# Patient Record
Sex: Male | Born: 1980 | Race: White | Hispanic: No | Marital: Married | Smoking: Never smoker
Health system: Southern US, Community
[De-identification: ages and names within clinical notes are randomized; demographics above are authoritative.]

## PROBLEM LIST (undated history)

## (undated) DIAGNOSIS — M519 Unspecified thoracic, thoracolumbar and lumbosacral intervertebral disc disorder: Secondary | ICD-10-CM

## (undated) HISTORY — DX: Unspecified thoracic, thoracolumbar and lumbosacral intervertebral disc disorder: M51.9

## (undated) HISTORY — PX: CHOLECYSTECTOMY: SHX55

---
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
## 2002-11-04 ENCOUNTER — Emergency Department (HOSPITAL_COMMUNITY): Admission: EM | Admit: 2002-11-04 | Discharge: 2002-11-04 | Payer: Self-pay | Admitting: Emergency Medicine

## 2002-11-15 ENCOUNTER — Encounter: Payer: Self-pay | Admitting: Emergency Medicine

## 2002-11-15 ENCOUNTER — Emergency Department (HOSPITAL_COMMUNITY): Admission: EM | Admit: 2002-11-15 | Discharge: 2002-11-15 | Payer: Self-pay | Admitting: Emergency Medicine

## 2002-11-30 ENCOUNTER — Ambulatory Visit (HOSPITAL_COMMUNITY): Admission: RE | Admit: 2002-11-30 | Discharge: 2002-11-30 | Payer: Self-pay | Admitting: Orthopedic Surgery

## 2002-11-30 ENCOUNTER — Encounter: Payer: Self-pay | Admitting: Orthopedic Surgery

## 2007-01-27 ENCOUNTER — Emergency Department (HOSPITAL_COMMUNITY): Admission: EM | Admit: 2007-01-27 | Discharge: 2007-01-27 | Payer: Self-pay | Admitting: Emergency Medicine

## 2007-02-21 ENCOUNTER — Ambulatory Visit: Payer: Self-pay | Admitting: Gastroenterology

## 2007-03-19 ENCOUNTER — Emergency Department (HOSPITAL_COMMUNITY): Admission: EM | Admit: 2007-03-19 | Discharge: 2007-03-19 | Payer: Self-pay | Admitting: Emergency Medicine

## 2007-05-31 DIAGNOSIS — R1013 Epigastric pain: Secondary | ICD-10-CM | POA: Insufficient documentation

## 2007-05-31 DIAGNOSIS — K8020 Calculus of gallbladder without cholecystitis without obstruction: Secondary | ICD-10-CM | POA: Insufficient documentation

## 2007-05-31 DIAGNOSIS — R112 Nausea with vomiting, unspecified: Secondary | ICD-10-CM

## 2010-08-24 NOTE — Assessment & Plan Note (Signed)
[facility]                         GASTROENTEROLOGY OFFICE NOTE   NAME:DAVISPerl, Kerney                    MRN:          [id number]  DATE:02/21/2007                            DOB:          October 07, 1980    REFERRING PHYSICIAN:  Dr. Vernie Murders [facility]   REASON FOR REFERRAL:  Dr. Patrica Duel asked me to evaluate Benjamin Jacobs in  consultation regarding biliary colic.   HISTORY OF PRESENT ILLNESS:  Benjamin Jacobs is a very pleasant 29 year old  man who presented to the emergency room 3-4 weeks ago with right upper  quadrant epigastric pain. The pain was very severe. It lasted 3-4 hours.  He did have associated nausea and vomiting. Lab tests and imaging  studies were performed. Labs showed normal CBC, normal complete  metabolic profile, normal liver tests. He had an ultrasound performed  and this showed a 6-mm gallstone in the gallbladder neck without  pericholecystic fluid or gallbladder wall thickening. Common bile duct  was normal. There is no duct dilatations. He was recommended to stay  away from fatty foods and was told to have a gastroenterologist  consultation. He has avoided fatty foods for the most part, although he  went to [employer] a couple of times and did again feel some right  upper quadrant discomfort that was pretty severe, but lasted only for 30-  40 minutes. There was no associated nausea or vomiting then. He does not  take any NSAIDs. He has no GERD.   REVIEW OF SYSTEMS:  Notable for stable weight, otherwise essentially  normal and is available on his nursing intake sheet.   PAST MEDICAL HISTORY:  None.   CURRENT MEDICATIONS:  None.   ALLERGIES:  No known drug allergies.   SOCIAL HISTORY:  Married with one 53-year-old daughter. Works for [employer]	[place]. Nonsmoker and nondrinker.   FAMILY HISTORY:  No colon cancer, colon polyps or gallbladder disease in  family.   PHYSICAL EXAMINATION:  Height 5 feet, 10 inches. Weight is 208 pounds,  blood pressure 120/72, pulse 86.  CONSTITUTIONAL: Generally well-appearing.  NEUROLOGIC: Alert and oriented x3.  EYES: Extraocular movements intact.  MOUTH: Oropharynx moist. No lesions.  NECK: Supple. No lymphadenopathy.  CARDIOVASCULAR: HEART: Regular rate and rhythm.  LUNGS:  Clear to auscultation bilaterally.  ABDOMEN: Soft and nontender, nondistended. Normal bowel sounds.  EXTREMITIES: No lower extremity edema.  SKIN: No rashes or lesions on visible extremities.   ASSESSMENT/PLAN:  A 30 year old man with likely biliary colic.   Clinically, it is consistent with biliary colic and he has a 6-mm stone  in the region of the neck of his gallbladder. Fatty foods are tending to  bring on right upper quadrant pains. He does not take NSAIDs and has no  GERD symptoms to warrant other GI evaluation and so I will arrange for  him to meet a general surgeon to consider laparoscopic cholecystectomy.  I see no reason for any further blood tests or imaging studies prior to  then.     Rachael Fee, MD  Electronically Signed    DPJ/MedQ  DD: 02/21/2007  DT: 02/21/2007  Job #: [id number]

## 2011-01-17 LAB — CBC
HCT: 43.8
Hemoglobin: 15.7
MCHC: 35.9
MCV: 94.2
Platelets: 226
RBC: 4.65
RDW: 12
WBC: 8.7

## 2011-01-17 LAB — DIFFERENTIAL
Basophils Absolute: 0
Basophils Relative: 0
Eosinophils Relative: 2
Lymphocytes Relative: 29
Monocytes Absolute: 0.5
Neutro Abs: 5.5

## 2011-01-17 LAB — LIPASE, BLOOD: Lipase: 26

## 2011-01-17 LAB — URINALYSIS, ROUTINE W REFLEX MICROSCOPIC
Bilirubin Urine: NEGATIVE
Glucose, UA: NEGATIVE
Hgb urine dipstick: NEGATIVE
Ketones, ur: NEGATIVE
Nitrite: NEGATIVE
Protein, ur: NEGATIVE
Specific Gravity, Urine: 1.016
Urobilinogen, UA: 0.2
pH: 7.5

## 2011-01-17 LAB — COMPREHENSIVE METABOLIC PANEL
AST: 24
Alkaline Phosphatase: 74
BUN: 11
CO2: 28
Chloride: 104
Creatinine, Ser: 1.22
GFR calc non Af Amer: >60
Potassium: 3.5
Total Bilirubin: 1.2

## 2011-01-19 LAB — CBC
MCHC: 36.1 — ABNORMAL HIGH
MCV: 94.3
Platelets: 215
RBC: 4.47
RDW: 12

## 2011-01-19 LAB — COMPREHENSIVE METABOLIC PANEL
ALT: 23
AST: 25
Albumin: 4.1
Calcium: 9.5
Creatinine, Ser: 1.11
GFR calc Af Amer: >60
GFR calc non Af Amer: >60
Sodium: 141
Total Protein: 6.9

## 2011-01-19 LAB — DIFFERENTIAL
Eosinophils Absolute: 0.1
Eosinophils Relative: 1
Lymphocytes Relative: 21
Lymphs Abs: 2
Monocytes Relative: 3

## 2011-01-19 LAB — URINALYSIS, ROUTINE W REFLEX MICROSCOPIC
Bilirubin Urine: NEGATIVE
Hgb urine dipstick: NEGATIVE
Nitrite: NEGATIVE
Protein, ur: NEGATIVE
Specific Gravity, Urine: 1.029
Urobilinogen, UA: 0.2

## 2011-01-19 LAB — LIPASE, BLOOD: Lipase: 28

## 2012-07-02 ENCOUNTER — Ambulatory Visit: Payer: BC Managed Care – PPO

## 2015-07-31 ENCOUNTER — Emergency Department (HOSPITAL_COMMUNITY): Payer: 59

## 2015-07-31 ENCOUNTER — Encounter (HOSPITAL_COMMUNITY): Payer: Self-pay | Admitting: Emergency Medicine

## 2015-07-31 ENCOUNTER — Emergency Department (HOSPITAL_COMMUNITY)
Admission: EM | Admit: 2015-07-31 | Discharge: 2015-07-31 | Disposition: A | Discharge status: Home / Self Care | Payer: 59 | Attending: Emergency Medicine | Admitting: Emergency Medicine

## 2015-07-31 DIAGNOSIS — N200 Calculus of kidney: Secondary | ICD-10-CM | POA: Diagnosis not present

## 2015-07-31 DIAGNOSIS — M545 Low back pain, unspecified: Secondary | ICD-10-CM

## 2015-07-31 LAB — CBC WITH DIFFERENTIAL/PLATELET
BASOS ABS: 0 10*3/uL (ref 0.0–0.1)
Basophils Relative: 0 %
EOS ABS: 0 10*3/uL (ref 0.0–0.7)
EOS PCT: 0 %
HCT: 40.1 % (ref 39.0–52.0)
HEMOGLOBIN: 14.1 g/dL (ref 13.0–17.0)
Lymphocytes Relative: 6 %
Lymphs Abs: 0.8 10*3/uL (ref 0.7–4.0)
MCH: 33.2 pg (ref 26.0–34.0)
MCHC: 35.2 g/dL (ref 30.0–36.0)
MCV: 94.4 fL (ref 78.0–100.0)
Monocytes Absolute: 0.4 10*3/uL (ref 0.1–1.0)
Monocytes Relative: 3 %
NEUTROS PCT: 91 %
Neutro Abs: 10.6 10*3/uL — ABNORMAL HIGH (ref 1.7–7.7)
PLATELETS: 206 10*3/uL (ref 150–400)
RBC: 4.25 MIL/uL (ref 4.22–5.81)
RDW: 11.5 % (ref 11.5–15.5)
WBC: 11.8 10*3/uL — AB (ref 4.0–10.5)

## 2015-07-31 LAB — BASIC METABOLIC PANEL
Anion gap: 12 (ref 5–15)
BUN: 19 mg/dL (ref 6–20)
CO2: 24 mmol/L (ref 22–32)
CREATININE: 1.23 mg/dL (ref 0.61–1.24)
Calcium: 9.4 mg/dL (ref 8.9–10.3)
Chloride: 103 mmol/L (ref 101–111)
Glucose, Bld: 131 mg/dL — ABNORMAL HIGH (ref 65–99)
POTASSIUM: 3.8 mmol/L (ref 3.5–5.1)
SODIUM: 139 mmol/L (ref 135–145)

## 2015-07-31 LAB — URINALYSIS, ROUTINE W REFLEX MICROSCOPIC
Bilirubin Urine: NEGATIVE
Glucose, UA: NEGATIVE mg/dL
Ketones, ur: 15 mg/dL — AB
Leukocytes, UA: NEGATIVE
NITRITE: NEGATIVE
PROTEIN: NEGATIVE mg/dL
SPECIFIC GRAVITY, URINE: 1.024 (ref 1.005–1.030)
pH: 7.5 (ref 5.0–8.0)

## 2015-07-31 LAB — URINE MICROSCOPIC-ADD ON: SQUAMOUS EPITHELIAL / LPF: NONE SEEN

## 2015-07-31 MED ORDER — HYDROCODONE-ACETAMINOPHEN 5-325 MG PO TABS: 1.0000 | ORAL_TABLET | Freq: Four times a day (QID) | ORAL | Status: DC | PRN

## 2015-07-31 MED ORDER — SODIUM CHLORIDE 0.9 % IV BOLUS (SEPSIS)
1000.0000 mL | Freq: Once | INTRAVENOUS | Status: AC
  Administered 2015-07-31: 1000 mL via INTRAVENOUS

## 2015-07-31 MED ORDER — MORPHINE SULFATE (PF) 4 MG/ML IV SOLN
4.0000 mg | Freq: Once | INTRAVENOUS | Status: AC
  Administered 2015-07-31: 4 mg via INTRAVENOUS
  Filled 2015-07-31: qty 1

## 2015-07-31 MED ORDER — ONDANSETRON HCL 4 MG/2ML IJ SOLN: 4.0000 mg | Freq: Once | INTRAMUSCULAR | Status: DC | PRN

## 2015-07-31 MED ORDER — NAPROXEN 500 MG PO TABS: 500.0000 mg | ORAL_TABLET | Freq: Two times a day (BID) | ORAL | Status: DC

## 2015-07-31 NOTE — ED Notes (Signed)
Patient transported to CT 

## 2015-07-31 NOTE — ED Notes (Signed)
Patient with back pain that started suddenly today around 0130.  Patient denies any injury to the area.  Denies any heavy lifting.

## 2015-07-31 NOTE — ED Provider Notes (Signed)
CSN: [id number]     Arrival date & time 07/31/15  4098 History   First MD Initiated Contact with Patient 07/31/15 0600     Chief Complaint  Patient presents with  . Back Pain     (Consider location/radiation/quality/duration/timing/severity/associated sxs/prior Treatment) Patient is a 35 y.o. male presenting with back pain. The history is provided by the patient and medical records. No language interpreter was used.  Back Pain Associated symptoms: no abdominal pain, no dysuria, no fever and no headaches      KINNETH FUJIWARA is a 35 y.o. male  with no pertinent PMH who presents to the Emergency Department complaining of acute onset of waxing-waning left lower back pain which woke him from his sleep at around 1:30 this morning (4 hours PTA). No medications taken prior to arrival. Patient admits to one episode of emesis and nausea which has now resolved. Patient denies abdominal pain, b/b incontinence, dysuria, hematuria, saddle anesthesia, chest pain, fever. No aggravating or alleviating factors noted. Denies injury or prior back injury.   History reviewed. No pertinent past medical history. History reviewed. No pertinent past surgical history. No family history on file. Social History  Substance Use Topics  . Smoking status: Never Smoker   . Smokeless tobacco: None  . Alcohol Use: Yes    Review of Systems  Constitutional: Negative for fever and chills.  HENT: Negative for congestion.   Eyes: Negative for visual disturbance.  Respiratory: Negative for cough and shortness of breath.   Cardiovascular: Negative.   Gastrointestinal: Positive for nausea and vomiting. Negative for abdominal pain, diarrhea and constipation.  Genitourinary: Negative for dysuria and hematuria.  Musculoskeletal: Positive for back pain. Negative for neck pain.  Skin: Negative for rash.  Neurological: Negative for dizziness and headaches.      Allergies  Review of patient's allergies indicates no known  allergies.  Home Medications   Prior to Admission medications   Medication Sig Start Date End Date Taking? Authorizing Provider  HYDROcodone-acetaminophen (NORCO/VICODIN) 5-325 MG tablet Take 1 tablet by mouth every 6 (six) hours as needed for severe pain. 07/31/15   Chase Picket Karan Inclan, PA-C  naproxen (NAPROSYN) 500 MG tablet Take 1 tablet (500 mg total) by mouth 2 (two) times daily. 07/31/15   Osby Sweetin Pilcher Emon Miggins, PA-C   BP 96/61 mmHg  Pulse 72  Temp(Src) 98.2 F (36.8 C) (Oral)  Resp 16  Ht  (1.727 m)  Wt 94.15 kg  BMI 31.57 kg/m2  SpO2 99% Physical Exam  Constitutional: He is oriented to person, place, and time. He appears well-developed and well-nourished.  Alert and in no acute distress  HENT:  Head: Normocephalic and atraumatic.  Neck:  Full range of motion without pain. No midline or paraspinal tenderness.  Cardiovascular: Normal rate, regular rhythm, normal heart sounds and intact distal pulses.  Exam reveals no gallop and no friction rub.   No murmur heard. Pulmonary/Chest: Effort normal and breath sounds normal. No respiratory distress. He has no wheezes. He has no rales. He exhibits no tenderness.  Abdominal: Soft. Bowel sounds are normal. He exhibits no distension and no mass. There is no tenderness. There is no rebound and no guarding.  No CVA tenderness.  Musculoskeletal:  Full range of motion. No midline or paraspinal tenderness. Pain is not reproducible with palpation. No overlying skin changes. 5/5 muscle strength 4.  Neurological: He is alert and oriented to person, place, and time.  Bilateral lower extremities neurovascularly intact.  Skin: Skin is warm and  dry.  Nursing note and vitals reviewed.   ED Course  Procedures (including critical care time) Labs Review Labs Reviewed  URINALYSIS, ROUTINE W REFLEX MICROSCOPIC (NOT AT [facility]) - Abnormal; Notable for the following:    APPearance CLOUDY (*)    Hgb urine dipstick LARGE (*)    Ketones, ur 15 (*)     All other components within normal limits  BASIC METABOLIC PANEL - Abnormal; Notable for the following:    Glucose, Bld 131 (*)    All other components within normal limits  CBC WITH DIFFERENTIAL/PLATELET - Abnormal; Notable for the following:    WBC 11.8 (*)    Neutro Abs 10.6 (*)    All other components within normal limits  URINE MICROSCOPIC-ADD ON - Abnormal; Notable for the following:    Bacteria, UA RARE (*)    All other components within normal limits    Imaging Review Ct Renal Stone Study  07/31/2015  CLINICAL DATA:  Left-sided flank pain for few hours EXAM: CT ABDOMEN AND PELVIS WITHOUT CONTRAST TECHNIQUE: Multidetector CT imaging of the abdomen and pelvis was performed following the standard protocol without IV contrast. COMPARISON:  None. FINDINGS: Lung bases are free of acute infiltrate or sizable effusion. The liver, spleen, adrenal glands and pancreas are within normal limits. The gallbladder has been surgically removed. Kidneys demonstrate no renal calculi. The right ureter and collecting system are within normal limits. Minimal fullness of the left ureter is seen although no ureteral calculus is noted. The bladder is well distended and there is a dependent 3 mm stone identified within the bladder best seen on image number 76 of series 201. These changes are consistent with a recently passed stone and a edema in the collecting system on the left. The appendix is within normal limits. Scattered diverticular change is noted without diverticulitis. No pelvic mass lesion is seen. The osseous structures are within normal limits. IMPRESSION: Changes consistent with a 3 mm recently passed stone within the urinary bladder. Mild dilatation is noted on the left consistent with edema from recently passed stone. Electronically Signed   By: Alcide CleverMark  Lukens M.D.   On: 07/31/2015 08:13   I have personally reviewed and evaluated these images and lab results as part of my medical decision-making.   EKG  Interpretation None      MDM   Final diagnoses:  Left low back pain  Kidney stone   Kingsley SpittleAlexander M Pha presents to ED for acute onset of left lower back pain that began about 4 hours prior to arrival. No history of back pain or recent injury. Otherwise healthy. Concern for kidney stone. IV fluids started.  7:44 AM - Patient re-evaluated. Pain controlled. Denies nausea. BMP reassuring. CBC reviewed - white count of 11.8. UA without signs of infection. Awaiting CT.   CT shows changes consistent with a recently passed 3 mm stone which is now in the bladder. No other acute findings are seen. Repeat abdominal exam with no abdominal tenderness, no surgical abdomen.  Will discharge to home with naproxen, short course of pain medication. Return precautions were given and all questions were answered.    [facility] HospitalJaime Pilcher Massiel Stipp, PA-C 07/31/15 [id number]  Shon Batonourtney F Horton, MD 07/31/15 2306

## 2015-07-31 NOTE — Discharge Instructions (Signed)
You have been diagnosed with kidney stones.  Use your pain medication only as needed for severe pain and do not operate heavy machinery while on this medication. Note that your pain medication contains Acetaminophen (Tylenol), therefore it is not recommended to take additional Tylenol while on your pain medication. Continue to drink fluids to help you pass the stones. Follow up with your primary care doctor in regards to your hospital visit.  Return to the ED immediately if you develop fever that persists > 101, uncontrolled pain or vomiting, or other concerns.  Read the instructions below to learn more about kidney stones.    Kidney Stones Kidney stones (ureteral lithiasis) are solid masses that form inside your kidneys. The intense pain is caused by the stone moving through the kidney, ureter, bladder, and urethra (urinary tract). When the stone moves, the ureter starts to spasm around the stone. The stone is usually passed in the urine.   HOME CARE  Drink enough fluids to keep your urine clear or pale yellow. This helps to get the stone out.   Only take medicine as told by your doctor.   Follow up with your doctor as told.   GET HELP RIGHT AWAY IF:   Your pain does not get better with medicine.   You have a fever.   Your pain increases and gets worse over 18 hours.   You have new belly (abdominal) pain.   You feel faint or pass out.   MAKE SURE YOU:   Understand these instructions.   Will watch your condition.   Will get help right away if you are not doing well or get worse.

## 2015-07-31 NOTE — ED Notes (Signed)
Pt given urine strainer

## 2016-09-09 ENCOUNTER — Ambulatory Visit (INDEPENDENT_AMBULATORY_CARE_PROVIDER_SITE_OTHER): Payer: Managed Care, Other (non HMO) | Admitting: Family

## 2016-09-09 ENCOUNTER — Encounter: Payer: Self-pay | Admitting: Family

## 2016-09-09 VITALS — BP 110/80 | HR 72 | Temp 98.6°F | Resp 16 | Ht 68.0 in | Wt 217.0 lb

## 2016-09-09 DIAGNOSIS — Z0000 Encounter for general adult medical examination without abnormal findings: Secondary | ICD-10-CM

## 2016-09-09 DIAGNOSIS — E6609 Other obesity due to excess calories: Secondary | ICD-10-CM | POA: Insufficient documentation

## 2016-09-09 DIAGNOSIS — Z6832 Body mass index (BMI) 32.0-32.9, adult: Secondary | ICD-10-CM | POA: Diagnosis not present

## 2016-09-09 MED ORDER — VALACYCLOVIR HCL 1 G PO TABS: 2000.0000 mg | ORAL_TABLET | Freq: Two times a day (BID) | ORAL | 2 refills | Status: DC

## 2016-09-09 NOTE — Patient Instructions (Addendum)
Thank you for choosing [facility].  SUMMARY AND INSTRUCTIONS:  Please continue to work on losing weight.   Medication:  Your prescription(s) have been submitted to your pharmacy or been printed and provided for you. Please take as directed and contact our office if you believe you are having problem(s) with the medication(s) or have any questions.  Labs:  Please stop by the lab on the lower level of the building for your blood work. Your results will be released to MyChart (or called to you) after review, usually within 72 hours after test completion. If any changes need to be made, you will be notified at that same time.  1.) The lab is open from 7:30am to 5:30 pm Monday-Friday 2.) No appointment is necessary 3.) Fasting (if needed) is 6-8 hours after food and drink; black coffee and water are okay   Follow up:  If your symptoms worsen or fail to improve, please contact our office for further instruction, or in case of emergency go directly to the emergency room at the closest medical facility.     Health Maintenance, Male A healthy lifestyle and preventive care is important for your health and wellness. Ask your health care provider about what schedule of regular examinations is right for you. What should I know about weight and diet? Eat a Healthy Diet  Eat plenty of vegetables, fruits, whole grains, low-fat dairy products, and lean protein.  Do not eat a lot of foods high in solid fats, added sugars, or salt.  Maintain a Healthy Weight Regular exercise can help you achieve or maintain a healthy weight. You should:  Do at least 150 minutes of exercise each week. The exercise should increase your heart rate and make you sweat (moderate-intensity exercise).  Do strength-training exercises at least twice a week.  Watch Your Levels of Cholesterol and Blood Lipids  Have your blood tested for lipids and cholesterol every 5 years starting at 11035 years of age. If you are  at high risk for heart disease, you should start having your blood tested when you are 34 years old. You may need to have your cholesterol levels checked more often if: ? Your lipid or cholesterol levels are high. ? You are older than 34 years of age. ? You are at high risk for heart disease.  What should I know about cancer screening? Many types of cancers can be detected early and may often be prevented. Lung Cancer  You should be screened every year for lung cancer if: ? You are a current smoker who has smoked for at least 30 years. ? You are a former smoker who has quit within the past 15 years.  Talk to your health care provider about your screening options, when you should start screening, and how often you should be screened.  Colorectal Cancer  Routine colorectal cancer screening usually begins at 34 years of age and should be repeated every 5-10 years until you are 34 years old. You may need to be screened more often if early forms of precancerous polyps or small growths are found. Your health care provider may recommend screening at an earlier age if you have risk factors for colon cancer.  Your health care provider may recommend using home test kits to check for hidden blood in the stool.  A small camera at the end of a tube can be used to examine your colon (sigmoidoscopy or colonoscopy). This checks for the earliest forms of colorectal cancer.  Prostate and  Testicular Cancer  Depending on your age and overall health, your health care provider may do certain tests to screen for prostate and testicular cancer.  Talk to your health care provider about any symptoms or concerns you have about testicular or prostate cancer.  Skin Cancer  Check your skin from head to toe regularly.  Tell your health care provider about any new moles or changes in moles, especially if: ? There is a change in a mole's size, shape, or color. ? You have a mole that is larger than a pencil  eraser.  Always use sunscreen. Apply sunscreen liberally and repeat throughout the day.  Protect yourself by wearing long sleeves, pants, a wide-brimmed hat, and sunglasses when outside.  What should I know about heart disease, diabetes, and high blood pressure?  If you are 23-45 years of age, have your blood pressure checked every 3-5 years. If you are 12 years of age or older, have your blood pressure checked every year. You should have your blood pressure measured twice-once when you are at a hospital or clinic, and once when you are not at a hospital or clinic. Record the average of the two measurements. To check your blood pressure when you are not at a hospital or clinic, you can use: ? An automated blood pressure machine at a pharmacy. ? A home blood pressure monitor.  Talk to your health care provider about your target blood pressure.  If you are between 46-28 years old, ask your health care provider if you should take aspirin to prevent heart disease.  Have regular diabetes screenings by checking your fasting blood sugar level. ? If you are at a normal weight and have a low risk for diabetes, have this test once every three years after the age of 57. ? If you are overweight and have a high risk for diabetes, consider being tested at a younger age or more often.  A one-time screening for abdominal aortic aneurysm (AAA) by ultrasound is recommended for men aged 38-75 years who are current or former smokers. What should I know about preventing infection? Hepatitis B If you have a higher risk for hepatitis B, you should be screened for this virus. Talk with your health care provider to find out if you are at risk for hepatitis B infection. Hepatitis C Blood testing is recommended for:  Everyone born from [birth year] through 1963.  Anyone with known risk factors for hepatitis C.  Sexually Transmitted Diseases (STDs)  You should be screened each year for STDs including gonorrhea and  chlamydia if: ? You are sexually active and are younger than 34 years of age. ? You are older than 34 years of age and your health care provider tells you that you are at risk for this type of infection. ? Your sexual activity has changed since you were last screened and you are at an increased risk for chlamydia or gonorrhea. Ask your health care provider if you are at risk.  Talk with your health care provider about whether you are at high risk of being infected with HIV. Your health care provider may recommend a prescription medicine to help prevent HIV infection.  What else can I do?  Schedule regular health, dental, and eye exams.  Stay current with your vaccines (immunizations).  Do not use any tobacco products, such as cigarettes, chewing tobacco, and e-cigarettes. If you need help quitting, ask your health care provider.  Limit alcohol intake to no more than 2 drinks per  day. One drink equals 12 ounces of beer, 5 ounces of wine, or 1 ounces of hard liquor.  Do not use street drugs.  Do not share needles.  Ask your health care provider for help if you need support or information about quitting drugs.  Tell your health care provider if you often feel depressed.  Tell your health care provider if you have ever been abused or do not feel safe at home. This information is not intended to replace advice given to you by your health care provider. Make sure you discuss any questions you have with your health care provider. Document Released: 09/24/2007 Document Revised: 11/25/2015 Document Reviewed: 12/30/2014 Elsevier Interactive Patient Education  2018 [employer].     Why follow it? Research shows. Those who follow the Mediterranean diet have a reduced risk of heart disease  The diet is associated with a reduced incidence of Parkinson's and Alzheimer's diseases People following the diet may have longer life expectancies and lower rates of chronic diseases  The Dietary  Guidelines for Americans recommends the Mediterranean diet as an eating plan to promote health and prevent disease  What Is the Mediterranean Diet?  Healthy eating plan based on typical foods and recipes of Mediterranean-style cooking The diet is primarily a plant based diet; these foods should make up a majority of meals   Starches - Plant based foods should make up a majority of meals - They are an important sources of vitamins, minerals, energy, antioxidants, and fiber - Choose whole grains, foods high in fiber and minimally processed items  - Typical grain sources include wheat, oats, barley, corn, brown rice, bulgar, farro, millet, polenta, couscous  - Various types of beans include chickpeas, lentils, fava beans, black beans, white beans   Fruits  Veggies - Large quantities of antioxidant rich fruits & veggies; 6 or more servings  - Vegetables can be eaten raw or lightly drizzled with oil and cooked  - Vegetables common to the traditional Mediterranean Diet include: artichokes, arugula, beets, broccoli, brussel sprouts, cabbage, carrots, celery, collard greens, cucumbers, eggplant, kale, leeks, lemons, lettuce, mushrooms, okra, onions, peas, peppers, potatoes, pumpkin, radishes, rutabaga, shallots, spinach, sweet potatoes, turnips, zucchini - Fruits common to the Mediterranean Diet include: apples, apricots, avocados, cherries, clementines, dates, figs, grapefruits, grapes, melons, nectarines, oranges, peaches, pears, pomegranates, strawberries, tangerines  Fats - Replace butter and margarine with healthy oils, such as olive oil, canola oil, and tahini  - Limit nuts to no more than a handful a day  - Nuts include walnuts, almonds, pecans, pistachios, pine nuts  - Limit or avoid candied, honey roasted or heavily salted nuts - Olives are central to the [employer] - can be eaten whole or used in a variety of dishes   Meats Protein - Limiting red meat: no more than a few times a  month - When eating red meat: choose lean cuts and keep the portion to the size of deck of cards - Eggs: approx. 0 to 4 times a week  - Fish and lean poultry: at least 2 a week  - Healthy protein sources include, chicken, [place], lean beef, lamb - Increase intake of seafood such as tuna, salmon, trout, mackerel, shrimp, scallops - Avoid or limit high fat processed meats such as sausage and bacon  Dairy - Include moderate amounts of low fat dairy products  - Focus on healthy dairy such as fat free yogurt, skim milk, low or reduced fat cheese - Limit dairy products higher in  fat such as whole or 2% milk, cheese, ice cream  Alcohol - Moderate amounts of red wine is ok  - No more than 5 oz daily for women (all ages) and men older than age 18  - No more than 10 oz of wine daily for men younger than 16  Other - Limit sweets and other desserts  - Use herbs and spices instead of salt to flavor foods  - Herbs and spices common to the traditional Mediterranean Diet include: basil, bay leaves, chives, cloves, cumin, fennel, garlic, lavender, marjoram, mint, oregano, parsley, pepper, rosemary, sage, savory, sumac, tarragon, thyme   It's not just a diet, it's a lifestyle:  The Mediterranean diet includes lifestyle factors typical of those in the region  Foods, drinks and meals are best eaten with others and savored Daily physical activity is important for overall good health This could be strenuous exercise like running and aerobics This could also be more leisurely activities such as walking, housework, yard-work, or taking the stairs Moderation is the key; a balanced and healthy diet accommodates most foods and drinks Consider portion sizes and frequency of consumption of certain foods   Meal Ideas & Options:  Breakfast:  Whole wheat toast or whole wheat English muffins with peanut butter & hard boiled egg Steel cut oats topped with apples & cinnamon and skim milk  Fresh fruit: banana,  strawberries, melon, berries, peaches  Smoothies: strawberries, bananas, greek yogurt, peanut butter Low fat greek yogurt with blueberries and granola  Egg white omelet with spinach and mushrooms Breakfast couscous: whole wheat couscous, apricots, skim milk, cranberries  Sandwiches:  Hummus and grilled vegetables (peppers, zucchini, squash) on whole wheat bread   Grilled chicken on whole wheat pita with lettuce, tomatoes, cucumbers or tzatziki  [place] salad on whole wheat bread: tuna salad made with greek yogurt, olives, red peppers, capers, green onions Garlic rosemary lamb pita: lamb sauted with garlic, rosemary, salt & pepper; add lettuce, cucumber, greek yogurt to pita - flavor with lemon juice and black pepper  Seafood:  Mediterranean grilled salmon, seasoned with garlic, basil, parsley, lemon juice and black pepper Shrimp, lemon, and spinach whole-grain pasta salad made with low fat greek yogurt  Seared scallops with lemon orzo  Seared tuna steaks seasoned salt, pepper, coriander topped with tomato mixture of olives, tomatoes, olive oil, minced garlic, parsley, green onions and cappers  Meats:  Herbed greek chicken salad with kalamata olives, cucumber, feta  Red bell peppers stuffed with spinach, bulgur, lean ground beef (or lentils) & topped with feta   Kebabs: skewers of chicken, tomatoes, onions, zucchini, squash  [place] burgers: made with red onions, mint, dill, lemon juice, feta cheese topped with roasted red peppers Vegetarian Cucumber salad: cucumbers, artichoke hearts, celery, red onion, feta cheese, tossed in olive oil & lemon juice  Hummus and whole grain pita points with a greek salad (lettuce, tomato, feta, olives, cucumbers, red onion) Lentil soup with celery, carrots made with vegetable broth, garlic, salt and pepper  Tabouli salad: parsley, bulgur, mint, scallions, cucumbers, tomato, radishes, lemon juice, olive oil, salt and pepper.

## 2016-09-09 NOTE — Assessment & Plan Note (Signed)
1) Anticipatory Guidance: Discussed importance of wearing a seatbelt while driving and not texting while driving; changing batteries in smoke detector at least once annually; wearing suntan lotion when outside; eating a balanced and moderate diet; getting physical activity at least 30 minutes per day.  2) Immunizations / Screenings / Labs:  Declines tetanus. All other immunizations are up-to-date per recommendations. Due for a vision exam encouraged to be completed independently. All other screenings are up-to-date per recommendations. Obtain CBC, CMET, and lipid profile.    Overall well exam with risk factors for cardiovascular disease including obesity. Recommend weight loss of 5-10% of current body weight through nutrition and physical activity changes. Encourage nutritional intake that is moderate, balance, and varied. Goal is to increase physical activity to 30 minutes of moderate activity daily or approximately 10,000 steps per day. Does get occasional cold sores with Valtrex prescription provided as needed. Continue other healthy lifestyle behaviors and choices. Follow-up prevention exam in 1 year. Follow-up office visit pending blood work.

## 2016-09-09 NOTE — Progress Notes (Signed)
Subjective:    Patient ID: Benjamin Jacobs, male    DOB: 1980/10/04, 36 y.o.   MRN: [id number]  Chief Complaint  Patient presents with  . Establish Care    CPE, not fasting    HPI:  Benjamin Jacobs is a 36 y.o. male who presents today for an annual wellness visit.   1) Health Maintenance -   Diet - Averages about 2-3 meals per day and a couple of snacks consisting of a regular diet; Caffeine intake of 2-3 cups per week.   Exercise - Works in a [employer]; play basketball 1x per week.   2) Preventative Exams / Immunizations:  Dental -- Up to date  Vision -- Due for exam   Health Maintenance  Topic Date Due  . HIV Screening  12/20/1997  . TETANUS/TDAP  09/11/2017 (Originally 12/20/2001)  . INFLUENZA VACCINE  11/09/2016     There is no immunization history on file for this patient.   No Known Allergies   Outpatient Medications Prior to Visit  Medication Sig Dispense Refill  . HYDROcodone-acetaminophen (NORCO/VICODIN) 5-325 MG tablet Take 1 tablet by mouth every 6 (six) hours as needed for severe pain. 5 tablet 0  . naproxen (NAPROSYN) 500 MG tablet Take 1 tablet (500 mg total) by mouth 2 (two) times daily. 20 tablet 0   No facility-administered medications prior to visit.      History reviewed. No pertinent past medical history.   Past Surgical History:  Procedure Laterality Date  . CHOLECYSTECTOMY       Family History  Problem Relation Age of Onset  . Diabetes Mother      Social History   Social History  . Marital status: Married    Spouse name: N/A  . Number of children: 2  . Years of education: 12   Occupational History  . Not on file.   Social History Main Topics  . Smoking status: Never Smoker  . Smokeless tobacco: Never Used  . Alcohol use 0.6 oz/week    1 Shots of liquor per week  . Drug use: Yes    Frequency: 2.0 times per week    Types: Marijuana  . Sexual activity: Yes   Other Topics Concern  . Not on file   Social  History Narrative   Fun/Hobby: Basketball; [employer]      Review of Systems  Constitutional: Denies fever, chills, fatigue, or significant weight gain/loss. HENT: Head: Denies headache or neck pain Ears: Denies changes in hearing, ringing in ears, earache, drainage Nose: Denies discharge, stuffiness, itching, nosebleed, sinus pain Throat: Denies sore throat, hoarseness, dry mouth, sores, thrush Eyes: Denies loss/changes in vision, pain, redness, blurry/double vision, flashing lights Cardiovascular: Denies chest pain/discomfort, tightness, palpitations, shortness of breath with activity, difficulty lying down, swelling, sudden awakening with shortness of breath Respiratory: Denies shortness of breath, cough, sputum production, wheezing Gastrointestinal: Denies dysphasia, heartburn, change in appetite, nausea, change in bowel habits, rectal bleeding, constipation, diarrhea, yellow skin or eyes Genitourinary: Denies frequency, urgency, burning/pain, blood in urine, incontinence, change in urinary strength. Musculoskeletal: Denies muscle/joint pain, stiffness, back pain, redness or swelling of joints, trauma Skin: Denies rashes, lumps, itching, dryness, color changes, or hair/nail changes Neurological: Denies dizziness, fainting, seizures, weakness, numbness, tingling, tremor Psychiatric - Denies nervousness, stress, depression or memory loss Endocrine: Denies heat or cold intolerance, sweating, frequent urination, excessive thirst, changes in appetite Hematologic: Denies ease of bruising or bleeding     Objective:     BP  110/80 (BP Location: Left Arm, Patient Position: Sitting, Cuff Size: Large)   Pulse 72   Temp 98.6 F (37 C) (Oral)   Resp 16   Ht 5\' 8"  (1.727 m)   Wt 217 lb (98.4 kg)   SpO2 98%   BMI 32.99 kg/m  Nursing note and vital signs reviewed.  Physical Exam  Constitutional: He is oriented to person, place, and time. He appears well-developed and well-nourished.    HENT:  Head: Normocephalic.  Right Ear: Hearing, tympanic membrane, external ear and ear canal normal.  Left Ear: Hearing, tympanic membrane, external ear and ear canal normal.  Nose: Nose normal.  Mouth/Throat: Uvula is midline, oropharynx is clear and moist and mucous membranes are normal.  Eyes: Conjunctivae and EOM are normal. Pupils are equal, round, and reactive to light.  Neck: Neck supple. No JVD present. No tracheal deviation present. No thyromegaly present.  Cardiovascular: Normal rate, regular rhythm, normal heart sounds and intact distal pulses.   Pulmonary/Chest: Effort normal and breath sounds normal.  Abdominal: Soft. Bowel sounds are normal. He exhibits no distension and no mass. There is no tenderness. There is no rebound and no guarding.  Musculoskeletal: Normal range of motion. He exhibits no edema or tenderness.  Lymphadenopathy:    He has no cervical adenopathy.  Neurological: He is alert and oriented to person, place, and time. He has normal reflexes. No cranial nerve deficit. He exhibits normal muscle tone. Coordination normal.  Skin: Skin is warm and dry.  Psychiatric: He has a normal mood and affect. His behavior is normal. Judgment and thought content normal.       Assessment & Plan:   Problem List Items Addressed This Visit      Other   Routine adult health maintenance - Primary    1) Anticipatory Guidance: Discussed importance of wearing a seatbelt while driving and not texting while driving; changing batteries in smoke detector at least once annually; wearing suntan lotion when outside; eating a balanced and moderate diet; getting physical activity at least 30 minutes per day.  2) Immunizations / Screenings / Labs:  Declines tetanus. All other immunizations are up-to-date per recommendations. Due for a vision exam encouraged to be completed independently. All other screenings are up-to-date per recommendations. Obtain CBC, CMET, and lipid profile.     Overall well exam with risk factors for cardiovascular disease including obesity. Recommend weight loss of 5-10% of current body weight through nutrition and physical activity changes. Encourage nutritional intake that is moderate, balance, and varied. Goal is to increase physical activity to 30 minutes of moderate activity daily or approximately 10,000 steps per day. Does get occasional cold sores with Valtrex prescription provided as needed. Continue other healthy lifestyle behaviors and choices. Follow-up prevention exam in 1 year. Follow-up office visit pending blood work.       Class 1 obesity due to excess calories without serious comorbidity with body mass index (BMI) of 32.0 to 32.9 in adult    BMI of 32.99. Recommend weight loss of 5-10% of current body weight. Recommend increasing physical activity to 30 minutes of moderate level activity daily. Encourage nutritional intake that focuses on nutrient dense foods and is moderate, varied, and balanced and is low in saturated fats and processed/sugary foods. Continue to monitor.            I have discontinued Benjamin Jacobs's naproxen and HYDROcodone-acetaminophen. I am also having him start on valACYclovir.   Meds ordered this encounter  Medications  .  valACYclovir (VALTREX) 1000 MG tablet    Sig: Take 2 tablets (2,000 mg total) by mouth 2 (two) times daily.    Dispense:  4 tablet    Refill:  2    Order Specific Question:   Supervising Provider    Answer:   Hillard Danker A [[id number]     Follow-up: Return in about 1 year (around 09/09/2017), or if symptoms worsen or fail to improve.   Jeanine Luz, FNP

## 2016-09-09 NOTE — Assessment & Plan Note (Signed)
BMI of 32.99. Recommend weight loss of 5-10% of current body weight. Recommend increasing physical activity to 30 minutes of moderate level activity daily. Encourage nutritional intake that focuses on nutrient dense foods and is moderate, varied, and balanced and is low in saturated fats and processed/sugary foods. Continue to monitor.

## 2016-09-12 LAB — CBC AND DIFFERENTIAL
HEMATOCRIT: 45 (ref 41–53)
Hemoglobin: 15.8 (ref 13.5–17.5)
Platelets: 231 (ref 150–399)

## 2016-09-22 ENCOUNTER — Other Ambulatory Visit: Payer: Self-pay | Admitting: Family

## 2016-09-22 LAB — CBC AND DIFFERENTIAL
NEUTROS ABS: 51
WBC: 6.7

## 2016-09-23 ENCOUNTER — Encounter: Payer: Self-pay | Admitting: Family

## 2016-09-23 LAB — COMPREHENSIVE METABOLIC PANEL
ALK PHOS: 66 IU/L (ref 39–117)
ALT: 28 IU/L (ref 0–44)
AST: 20 IU/L (ref 0–40)
Albumin/Globulin Ratio: 1.7 (ref 1.2–2.2)
Albumin: 4.7 g/dL (ref 3.5–5.5)
BILIRUBIN TOTAL: 1 mg/dL (ref 0.0–1.2)
BUN/Creatinine Ratio: 17 (ref 9–20)
BUN: 16 mg/dL (ref 6–20)
CHLORIDE: 99 mmol/L (ref 96–106)
CO2: 25 mmol/L (ref 20–29)
CREATININE: 0.93 mg/dL (ref 0.76–1.27)
Calcium: 9.5 mg/dL (ref 8.7–10.2)
GFR calc Af Amer: 124 mL/min/{1.73_m2} (ref >59)
GFR calc non Af Amer: 107 mL/min/{1.73_m2} (ref >59)
GLUCOSE: 102 mg/dL — AB (ref 65–99)
Globulin, Total: 2.8 g/dL (ref 1.5–4.5)
Potassium: 4 mmol/L (ref 3.5–5.2)
Sodium: 138 mmol/L (ref 134–144)
Total Protein: 7.5 g/dL (ref 6.0–8.5)

## 2016-09-23 LAB — CBC/DIFF AMBIGUOUS DEFAULT
BASOS ABS: 0 10*3/uL (ref 0.0–0.2)
Basos: 0 %
EOS (ABSOLUTE): 0.2 10*3/uL (ref 0.0–0.4)
Eos: 3 %
HEMOGLOBIN: 15.8 g/dL (ref 13.0–17.7)
Hematocrit: 44.8 % (ref 37.5–51.0)
Immature Grans (Abs): 0 10*3/uL (ref 0.0–0.1)
Immature Granulocytes: 0 %
LYMPHS ABS: 2.8 10*3/uL (ref 0.7–3.1)
Lymphs: 41 %
MCH: 34.6 pg — ABNORMAL HIGH (ref 26.6–33.0)
MCHC: 35.3 g/dL (ref 31.5–35.7)
MCV: 98 fL — ABNORMAL HIGH (ref 79–97)
MONOCYTES: 5 %
Monocytes Absolute: 0.3 10*3/uL (ref 0.1–0.9)
Neutrophils Absolute: 3.3 10*3/uL (ref 1.4–7.0)
Neutrophils: 51 %
PLATELETS: 231 10*3/uL (ref 150–379)
RBC: 4.57 x10E6/uL (ref 4.14–5.80)
RDW: 12.5 % (ref 12.3–15.4)
WBC: 6.7 10*3/uL (ref 3.4–10.8)

## 2016-09-23 LAB — LIPID PANEL W/O CHOL/HDL RATIO
CHOLESTEROL TOTAL: 147 mg/dL (ref 100–199)
HDL: 33 mg/dL — ABNORMAL LOW (ref >39)
LDL CALC: 70 mg/dL (ref 0–99)
TRIGLYCERIDES: 221 mg/dL — AB (ref 0–149)
VLDL CHOLESTEROL CAL: 44 mg/dL — AB (ref 5–40)

## 2016-09-23 LAB — AMBIG ABBREV LP DEFAULT

## 2016-09-23 LAB — AMBIG ABBREV CMP14 DEFAULT

## 2016-10-10 ENCOUNTER — Ambulatory Visit (INDEPENDENT_AMBULATORY_CARE_PROVIDER_SITE_OTHER): Payer: Managed Care, Other (non HMO) | Admitting: Family Medicine

## 2016-10-10 ENCOUNTER — Encounter: Payer: Self-pay | Admitting: Family Medicine

## 2016-10-10 VITALS — BP 130/70 | HR 81 | Temp 98.4°F | Resp 12 | Ht 68.0 in | Wt 221.0 lb

## 2016-10-10 DIAGNOSIS — L819 Disorder of pigmentation, unspecified: Secondary | ICD-10-CM

## 2016-10-10 NOTE — Progress Notes (Signed)
   Subjective:    Patient ID: Benjamin Jacobs, male    DOB: 1981-02-17, 36 y.o.   MRN: [id number]  HPI This is a 36 yo male who presents today with a bump at the base of his penis for about 1 month, never had similar episode, area not changing. No penile discharge, no burning or itching. No testicular mass/pain.Sexually active with wife only. No problems with intercourse.    No past medical history on file. Past Surgical History:  Procedure Laterality Date  . CHOLECYSTECTOMY     Family History  Problem Relation Age of Onset  . Diabetes Mother    Social History  Substance Use Topics  . Smoking status: Never Smoker  . Smokeless tobacco: Never Used  . Alcohol use 0.6 oz/week    1 Shots of liquor per week      Review of Systems Per HPI    Objective:   Physical Exam  Constitutional: He is oriented to person, place, and time. He appears well-developed and well-nourished.  HENT:  Head: Normocephalic and atraumatic.  Cardiovascular: Normal rate.   Pulmonary/Chest: Effort normal.  Genitourinary: Testes normal and penis normal. Circumcised. No penile erythema. No discharge found.  Neurological: He is alert and oriented to person, place, and time.  Skin: Skin is warm and dry. Lesion (left base of penile shaft with approx. 6 mm superficial, round area increased pigmentation. No erythema, non tender, no drainage. ) noted. He is not diaphoretic.  Psychiatric: He has a normal mood and affect. His behavior is normal. Judgment and thought content normal.  Vitals reviewed.        BP 130/70 (BP Location: Left Arm, Patient Position: Sitting, Cuff Size: Normal)   Pulse 81   Temp 98.4 F (36.9 C) (Oral)   Resp 12   Ht 5\' 8"  (1.727 m)   Wt 221 lb (100.2 kg)   SpO2 98%   BMI 33.60 kg/m  Wt Readings from Last 3 Encounters:  10/10/16 221 lb (100.2 kg)  09/09/16 217 lb (98.4 kg)  07/31/15 207 lb 9 oz (94.1 kg)    Assessment & Plan:  1. Atypical pigmented skin lesion - does not  look like HSV or HPV, isolated lesion, will refer to dermatology - Ambulatory referral to Dermatology  Olean Reeeborah Lakhia Gengler, FNP-BC  [facility] [place], [facility]  10/10/2016 5:06 PM

## 2016-10-10 NOTE — Patient Instructions (Signed)
You should get a call within the next couple of business days for a dermatology appointment

## 2016-11-14 IMAGING — CT CT RENAL STONE PROTOCOL
2 of 4 series · 10 of 46 positions shown, 11 images · non-contrast
Comparison: None.

CLINICAL DATA: Left-sided flank pain for few hours

EXAM:
CT ABDOMEN AND PELVIS WITHOUT CONTRAST
TECHNIQUE: Multidetector CT imaging of the abdomen and pelvis was performed
following the standard protocol without IV contrast.

[Series 201: routine, idose (2) · axial · 0.84mm/px · z∈[+116,+546]mm · 7 of 104 slices shown, 8 images]
[im 9/104  soft-tissue]
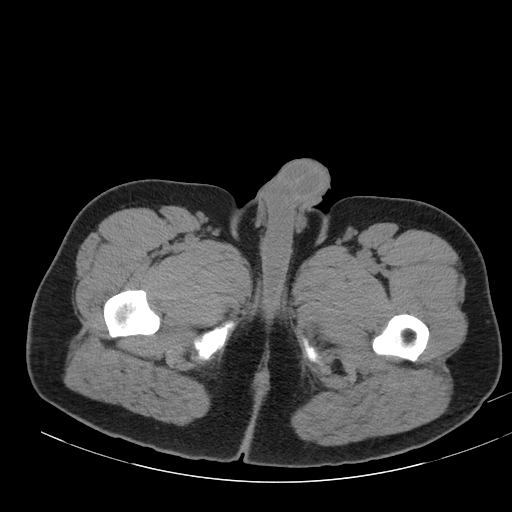
[im 9/104  bone]
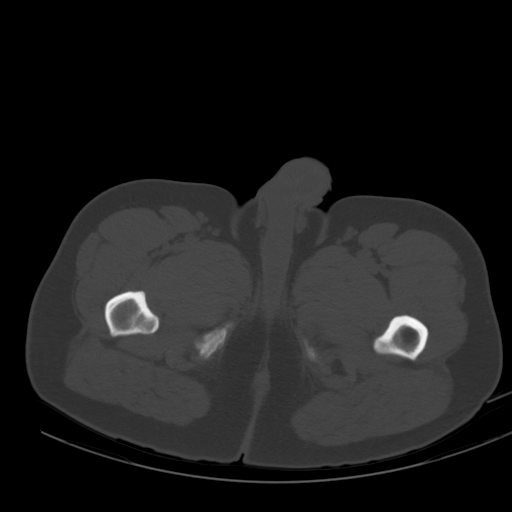
[im 22/104  soft-tissue]
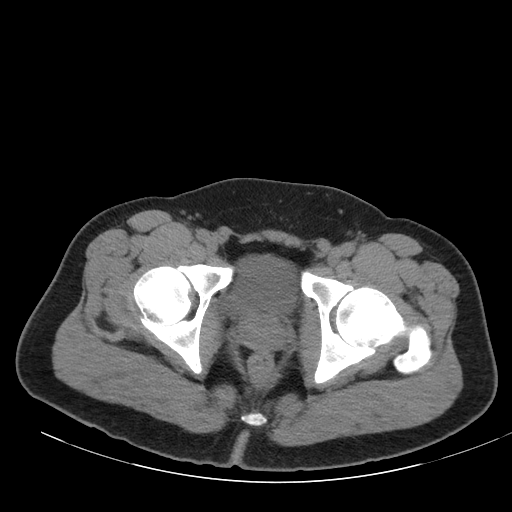
[im 39/104  soft-tissue]
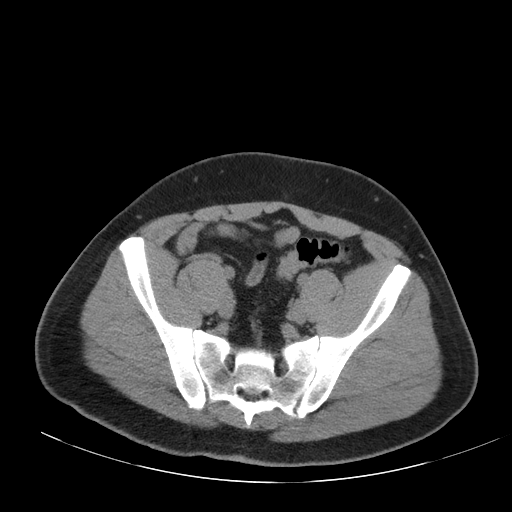
[im 52/104  soft-tissue]
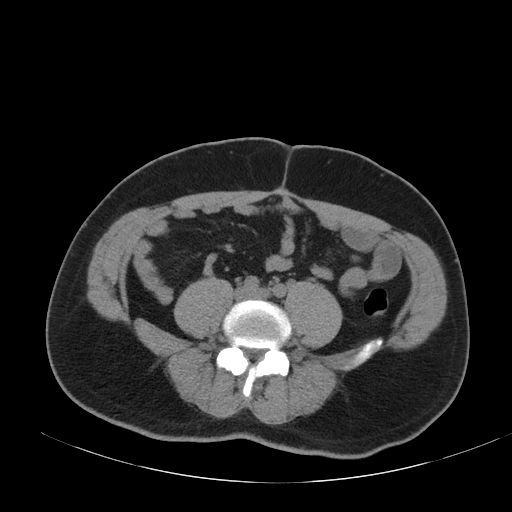
[im 65/104  soft-tissue]
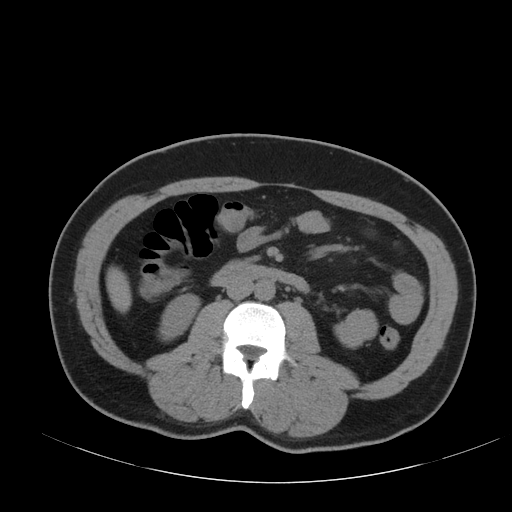
[im 82/104  soft-tissue]
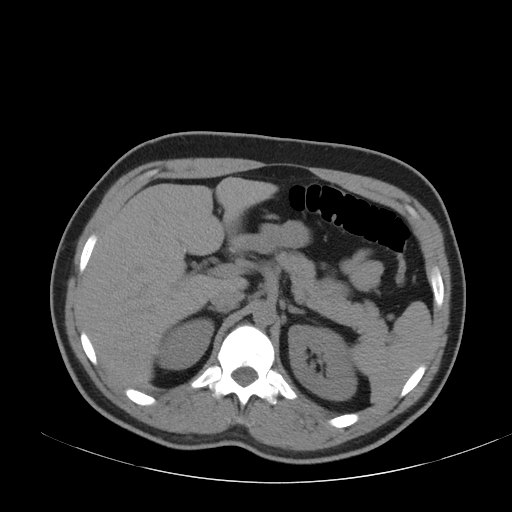
[im 95/104  soft-tissue]
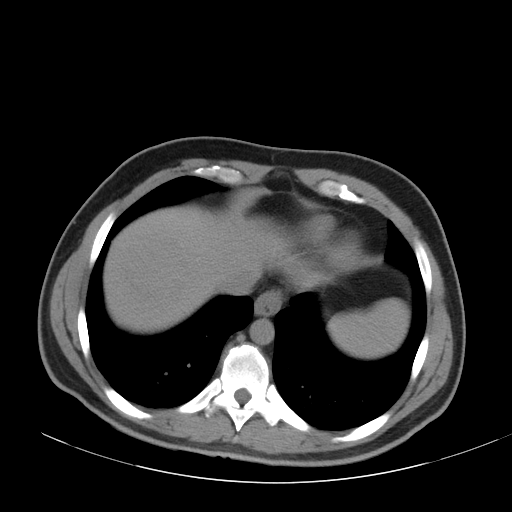

[Series 203: coronals, idose (2) · coronal · 0.45mm/px · 3 of 130 slices shown]
[im 44/130  soft-tissue]
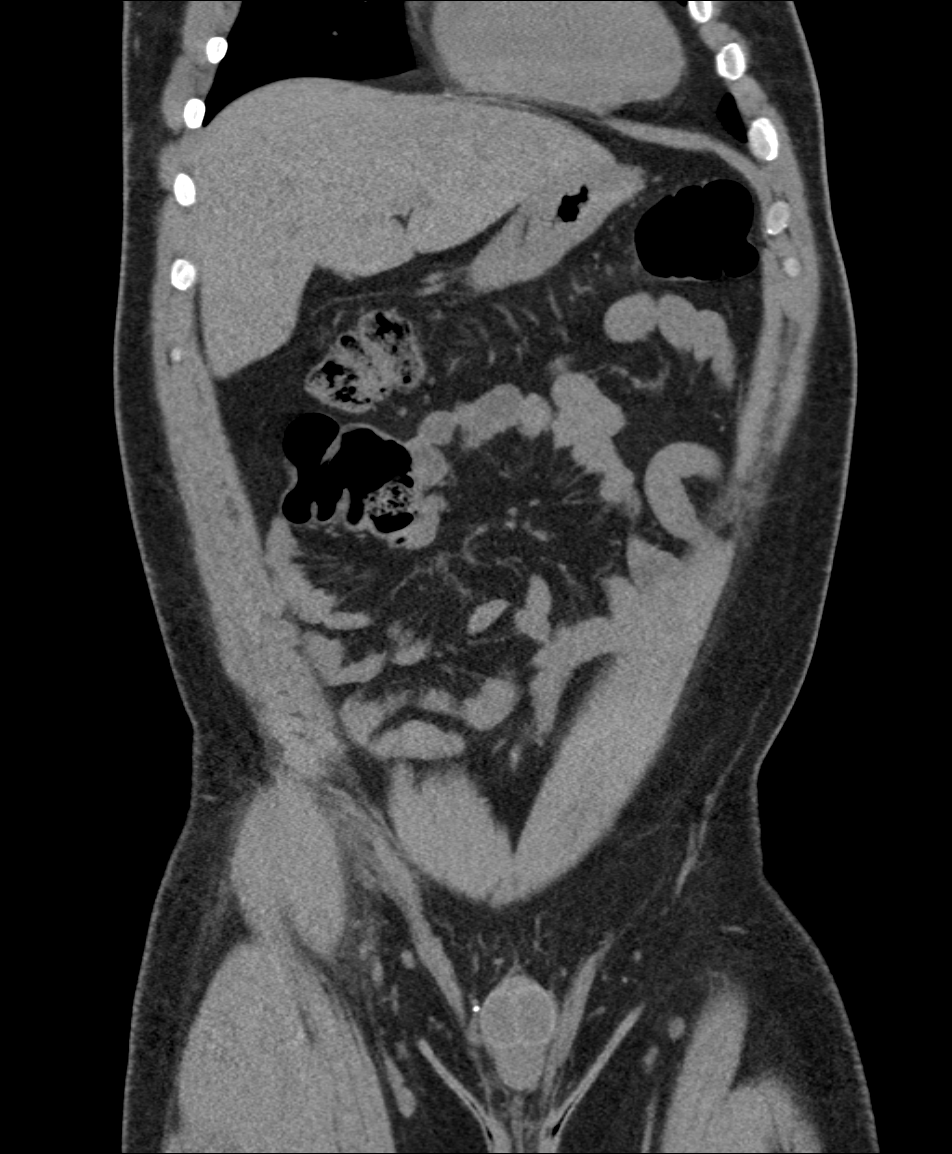
[im 58/130  soft-tissue]
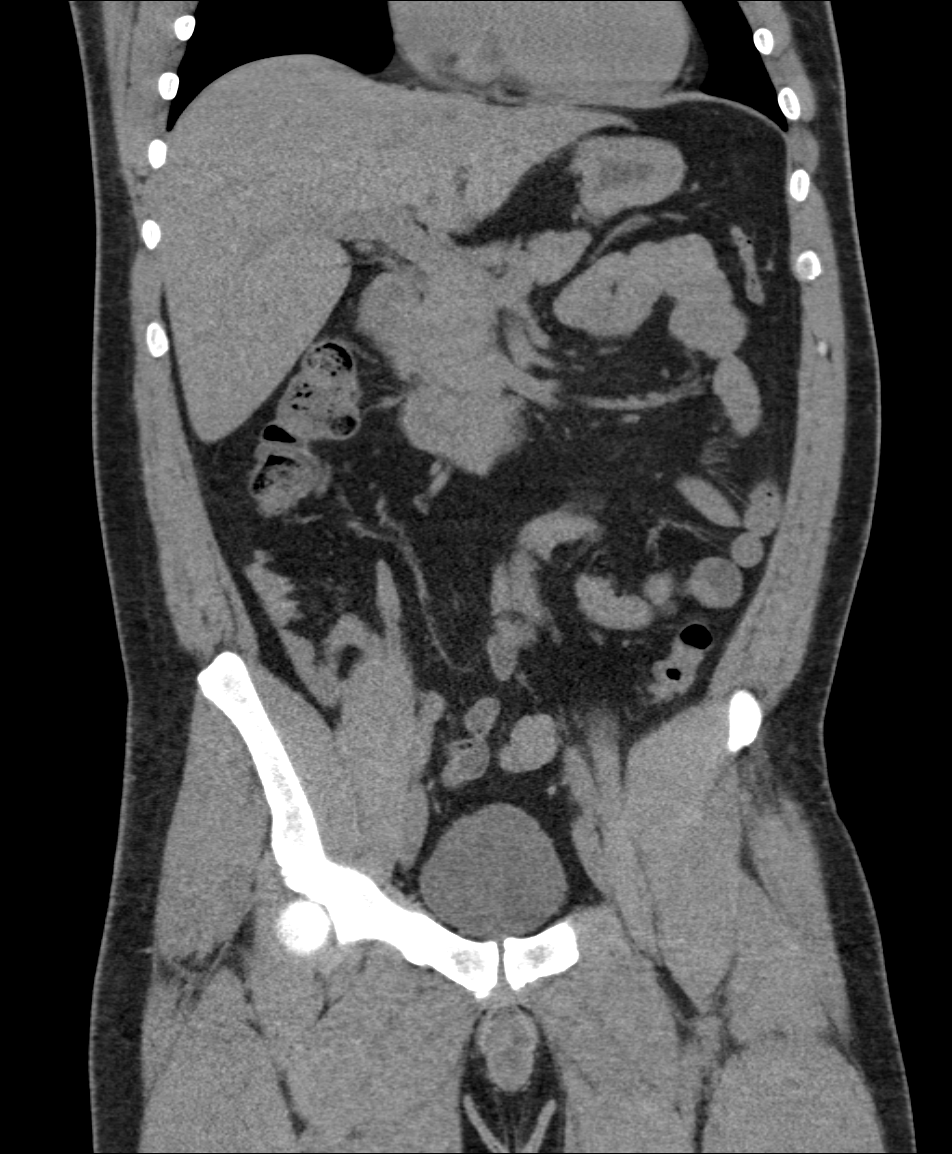
[im 72/130  soft-tissue]
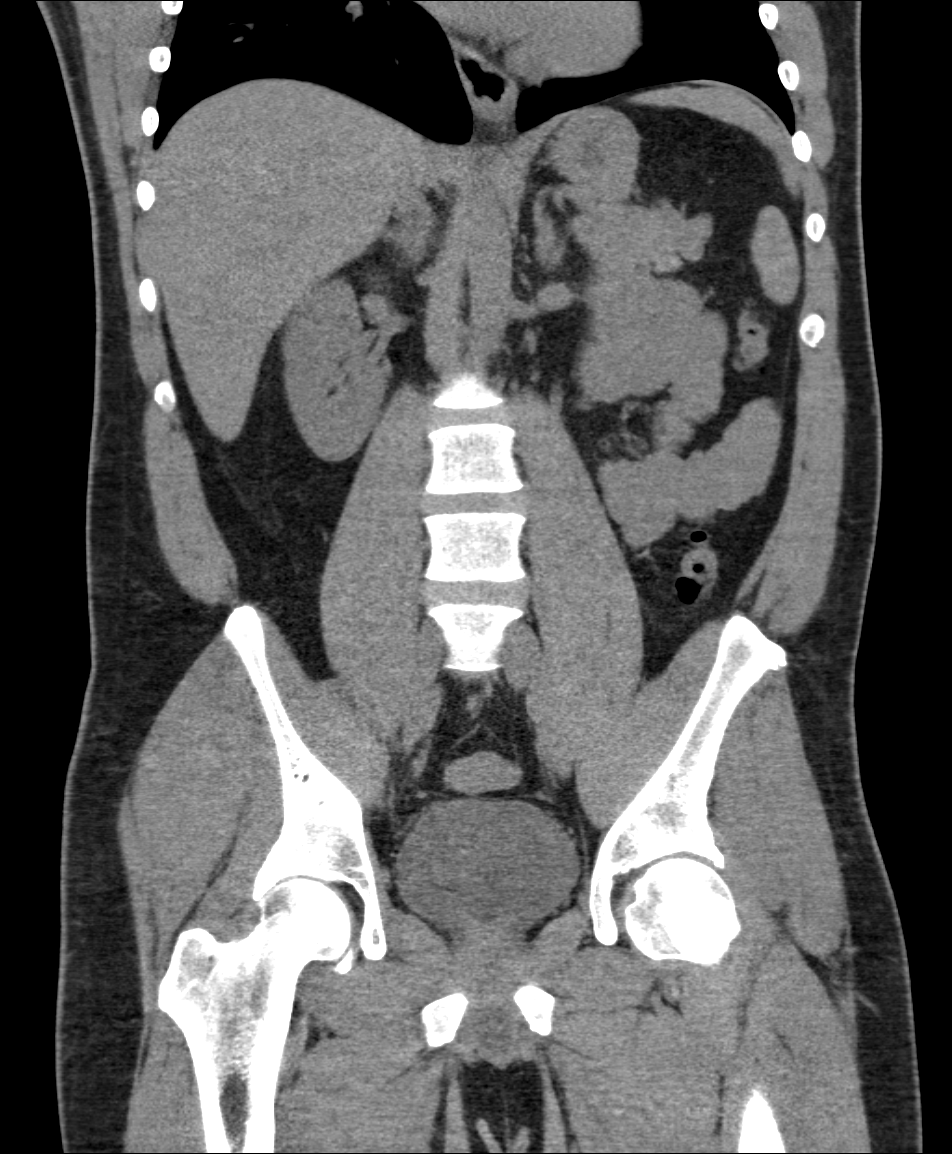

[10 of 46 positions shown; findings below may reference images not displayed]

FINDINGS: Lung bases are free of acute infiltrate or sizable effusion.

The liver, spleen, adrenal glands and pancreas are within normal
limits. The gallbladder has been surgically removed. Kidneys
demonstrate no renal calculi. The right ureter and collecting system
are within normal limits. Minimal fullness of the left ureter is
seen although no ureteral calculus is noted. The bladder is well
distended and there is a dependent 3 mm stone identified within the
bladder best seen on image number 76 of series 201. These changes
are consistent with a recently passed stone and a edema in the
collecting system on the left.

The appendix is within normal limits. Scattered diverticular change
is noted without diverticulitis. No pelvic mass lesion is seen. The
osseous structures are within normal limits.
IMPRESSION: Changes consistent with a 3 mm recently passed stone within the
urinary bladder. Mild dilatation is noted on the left consistent
with edema from recently passed stone.

## 2017-02-27 ENCOUNTER — Ambulatory Visit: Payer: Managed Care, Other (non HMO) | Admitting: Internal Medicine

## 2017-02-27 ENCOUNTER — Encounter: Payer: Self-pay | Admitting: Internal Medicine

## 2017-02-27 VITALS — BP 120/74 | HR 94 | Temp 98.8°F | Ht 68.0 in | Wt 222.0 lb

## 2017-02-27 DIAGNOSIS — E6609 Other obesity due to excess calories: Secondary | ICD-10-CM

## 2017-02-27 DIAGNOSIS — R739 Hyperglycemia, unspecified: Secondary | ICD-10-CM

## 2017-02-27 DIAGNOSIS — E785 Hyperlipidemia, unspecified: Secondary | ICD-10-CM | POA: Diagnosis not present

## 2017-02-27 DIAGNOSIS — Z6832 Body mass index (BMI) 32.0-32.9, adult: Secondary | ICD-10-CM

## 2017-02-27 NOTE — Progress Notes (Signed)
   Subjective:    Patient ID: Benjamin Jacobs, male    DOB: 06/15/1980, 36 y.o.   MRN: [id number]  HPI  Here to f/u; overall doing ok,  Pt denies chest pain, increasing sob or doe, wheezing, orthopnea, PND, increased LE swelling, palpitations, dizziness or syncope.  Pt denies new neurological symptoms such as new headache, or facial or extremity weakness or numbness.  Pt denies polydipsia, polyuria, or low sugar episode.  Pt states overall good compliance with meds, mostly trying to follow appropriate diet, with wt overall stable,  but little exercise however. Working on wt loss with more exercise - 5-7 hours (running, pushups and basketball), less calorie intake diet 2000 calories, cutting back on fast food,  Declines flu shot.   History reviewed. No pertinent past medical history. Past Surgical History:  Procedure Laterality Date  . CHOLECYSTECTOMY      reports that  has never smoked. he has never used smokeless tobacco. He reports that he drinks about 0.6 oz of alcohol per week. He reports that he uses drugs. Drug: Marijuana. Frequency: 2.00 times per week. family history includes Diabetes in his mother. No Known Allergies Current Outpatient Medications on File Prior to Visit  Medication Sig Dispense Refill  . valACYclovir (VALTREX) 1000 MG tablet Take 2 tablets (2,000 mg total) by mouth 2 (two) times daily. 4 tablet 2   No current facility-administered medications on file prior to visit.    Review of Systems  Constitutional: Negative for other unusual diaphoresis or sweats HENT: Negative for ear discharge or swelling Eyes: Negative for other worsening visual disturbances Respiratory: Negative for stridor or other swelling  Gastrointestinal: Negative for worsening distension or other blood Genitourinary: Negative for retention or other urinary change Musculoskeletal: Negative for other MSK pain or swelling Skin: Negative for color change or other new lesions Neurological: Negative for  worsening tremors and other numbness  Psychiatric/Behavioral: Negative for worsening agitation or other fatigue All other system neg per pt    Objective:   Physical Exam BP 120/74   Pulse 94   Temp 98.8 F (37.1 C) (Oral)   Ht 5\' 8"  (1.727 m)   Wt 222 lb (100.7 kg)   SpO2 98%   BMI 33.75 kg/m  VS noted, obese Constitutional: Pt appears in NAD HENT: Head: NCAT.  Right Ear: External ear normal.  Left Ear: External ear normal.  Eyes: . Pupils are equal, round, and reactive to light. Conjunctivae and EOM are normal Nose: without d/c or deformity Neck: Neck supple. Gross normal ROM Cardiovascular: Normal rate and regular rhythm.   Pulmonary/Chest: Effort normal and breath sounds without rales or wheezing.  Neurological: Pt is alert. At baseline orientation, motor grossly intact Skin: Skin is warm. No rashes, other new lesions, no LE edema Psychiatric: Pt behavior is normal without agitation  No other exam findings    Assessment & Plan:

## 2017-02-27 NOTE — Assessment & Plan Note (Signed)
Encouraged for more exercise, wt loss

## 2017-02-27 NOTE — Assessment & Plan Note (Signed)
Asympt, for contd wt control and exercise increase, for a1c with next labs

## 2017-02-27 NOTE — Patient Instructions (Addendum)
Please fax your form to [phone], to Dr Jonny RuizJohn  Please continue all other medications as before, and refills have been done if requested.  Please have the pharmacy call with any other refills you may need.  Please continue your efforts at being more active, low cholesterol diet, and weight control.  Please keep your appointments with your specialists as you may have planned  Please return in 6 months, or sooner if needed, with [facility] (you are given the order today)

## 2017-02-27 NOTE — Assessment & Plan Note (Signed)
With Hypertriglycerides, low HDL - for low fat diet, increased excercise

## 2017-04-24 ENCOUNTER — Encounter (HOSPITAL_COMMUNITY): Payer: Self-pay | Admitting: Emergency Medicine

## 2017-04-24 ENCOUNTER — Ambulatory Visit (HOSPITAL_COMMUNITY)
Admission: EM | Admit: 2017-04-24 | Discharge: 2017-04-24 | Disposition: A | Discharge status: Home / Self Care | Payer: Managed Care, Other (non HMO) | Attending: Family Medicine | Admitting: Family Medicine

## 2017-04-24 DIAGNOSIS — L739 Follicular disorder, unspecified: Secondary | ICD-10-CM

## 2017-04-24 MED ORDER — CEPHALEXIN 500 MG PO CAPS: 500.0000 mg | ORAL_CAPSULE | Freq: Four times a day (QID) | ORAL | 0 refills | Status: AC

## 2017-04-24 NOTE — ED Provider Notes (Signed)
[facility]    CSN: [id number] Arrival date & time: 04/24/17  1820     History   Chief Complaint Chief Complaint  Patient presents with  . Abscess    HPI Benjamin Jacobs is a 37 y.o. male.   Benjamin Jacobs presents with concerns of ingrown hair to pubic region which he noticed two days ago and then yesterday worsened in pain. It is red and tender. Without head. He states today he was able to get a small amount of drainage from it which did help with pain. Denies any previous similar. Denies history of MRSA. Without fevers. No other rash. Pain is 7/10, worse if touched.    ROS per HPI.       History reviewed. No pertinent past medical history.  Patient Active Problem List   Diagnosis Date Noted  . Dyslipidemia 02/27/2017  . Hyperglycemia 02/27/2017  . Routine adult health maintenance 09/09/2016  . Class 1 obesity due to excess calories without serious comorbidity with body mass index (BMI) of 32.0 to 32.9 in adult 09/09/2016  . 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 EPIGASTRIC PAIN 05/31/2007    Past Surgical History:  Procedure Laterality Date  . CHOLECYSTECTOMY         Home Medications    Prior to Admission medications   Medication Sig Start Date End Date Taking? Authorizing Provider  cephALEXin (KEFLEX) 500 MG capsule Take 1 capsule (500 mg total) by mouth 4 (four) times daily for 7 days. 04/24/17 05/01/17  Georgetta HaberBurky, Earlie Arciga B, NP  valACYclovir (VALTREX) 1000 MG tablet Take 2 tablets (2,000 mg total) by mouth 2 (two) times daily. 09/09/16   Veryl Speakalone, Gregory D, FNP    Family History Family History  Problem Relation Age of Onset  . Diabetes Mother     Social History Social History   Tobacco Use  . Smoking status: Never Smoker  . Smokeless tobacco: Never Used  Substance Use Topics  . Alcohol use: No    Alcohol/week: 0.6 oz    Types: 1 Shots of liquor per week    Frequency: Never  . Drug use: Yes    Frequency: 2.0 times per week    Types: Marijuana      Allergies   Patient has no known allergies.   Review of Systems Review of Systems   Physical Exam Triage Vital Signs ED Triage Vitals  Enc Vitals Group     BP 04/24/17 1844 130/86     Pulse Rate 04/24/17 1843 90     Resp 04/24/17 1843 16     Temp 04/24/17 1843 98.9 F (37.2 C)     Temp Source 04/24/17 1843 Oral     SpO2 04/24/17 1843 98 %     Weight --      Height --      Head Circumference --      Peak Flow --      Pain Score 04/24/17 1844 7     Pain Loc --      Pain Edu? --      Excl. in GC? --    No data found.  Updated Vital Signs BP 130/86   Pulse 98   Temp 98.9 F (37.2 C) (Oral)   Resp 16   SpO2 97%   Visual Acuity Right Eye Distance:   Left Eye Distance:   Bilateral Distance:    Right Eye Near:   Left Eye Near:    Bilateral Near:     Physical Exam  Constitutional: He  is oriented to person, place, and time. He appears well-developed and well-nourished.  Cardiovascular: Normal rate and regular rhythm.  Pulmonary/Chest: Effort normal and breath sounds normal.  Genitourinary:     Genitourinary Comments: Approximately 1.5cm area of firm red skin surrounding hair follicle; without palpable abscess or fluctuance; without drainage  Neurological: He is alert and oriented to person, place, and time.  Skin: Skin is warm and dry.     UC Treatments / Results  Labs (all labs ordered are listed, but only abnormal results are displayed) Labs Reviewed - No data to display  EKG  EKG Interpretation None       Radiology No results found.  Procedures Procedures (including critical care time)  Medications Ordered in UC Medications - No data to display   Initial Impression / Assessment and Plan / UC Course  I have reviewed the triage vital signs and the nursing notes.  Pertinent labs & imaging results that were available during my care of the patient were reviewed by me and considered in my medical decision making (see chart for  details).     Without palpable abscess for incision and drainage today. Keflex initiated, warm compresses recommended. Discussed signs to return for possible I&d. Ibuprofen for pain control. If symptoms worsen or do not improve in the next week to return to be seen or to follow up with PCP.  Patient verbalized understanding and agreeable to plan.    Final Clinical Impressions(s) / UC Diagnoses   Final diagnoses:  Folliculitis    ED Discharge Orders        Ordered    cephALEXin (KEFLEX) 500 MG capsule  4 times daily     04/24/17 1853       Controlled Substance Prescriptions [place] Controlled Substance Registry consulted? Not Applicable   Georgetta Haber, NP 04/24/17 1901

## 2017-04-24 NOTE — Discharge Instructions (Signed)
Warm compress to the area 4 times a day, 15-20 minutes at a time. Complete course of antibiotics. Tylenol and/or ibuprofen as needed for pain. Would expect to notice improvement after approximately 48 hours of starting antibiotic. If redness and swelling is worsening after 48 hours or no improvement with antibiotics return to be seen or follow up with your primary care provider as you may need incision and drainage.

## 2017-04-24 NOTE — ED Triage Notes (Signed)
PT reports a suspected ingrown hair on left groin. PT reports he tried to drain it Friday and it has gotten much worse.

## 2017-04-26 ENCOUNTER — Encounter: Payer: Self-pay | Admitting: Family

## 2017-04-26 ENCOUNTER — Ambulatory Visit: Payer: Managed Care, Other (non HMO) | Admitting: Family

## 2017-04-26 VITALS — BP 120/82 | HR 97 | Temp 99.0°F | Ht 68.0 in | Wt 219.1 lb

## 2017-04-26 DIAGNOSIS — L0291 Cutaneous abscess, unspecified: Secondary | ICD-10-CM | POA: Diagnosis not present

## 2017-04-26 MED ORDER — SULFAMETHOXAZOLE-TRIMETHOPRIM 800-160 MG PO TABS: 1.0000 | ORAL_TABLET | Freq: Two times a day (BID) | ORAL | 0 refills | Status: DC

## 2017-04-26 NOTE — Patient Instructions (Signed)
Keep applying the heat; [facility] for tomorrow.

## 2017-04-26 NOTE — Progress Notes (Signed)
  Benjamin Jacobs is a 37 y.o. male with the following history as recorded in EpicCare:  Patient Active Problem List   Diagnosis Date Noted  . Dyslipidemia 02/27/2017  . Hyperglycemia 02/27/2017  . Routine adult health maintenance 09/09/2016  . Class 1 obesity due to excess calories without serious comorbidity with body mass index (BMI) of 32.0 to 32.9 in adult 09/09/2016  . 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 EPIGASTRIC PAIN 05/31/2007    Current Outpatient Medications  Medication Sig Dispense Refill  . cephALEXin (KEFLEX) 500 MG capsule Take 1 capsule (500 mg total) by mouth 4 (four) times daily for 7 days. 28 capsule 0  . valACYclovir (VALTREX) 1000 MG tablet Take 2 tablets (2,000 mg total) by mouth 2 (two) times daily. 4 tablet 2  . sulfamethoxazole-trimethoprim (BACTRIM DS,SEPTRA DS) 800-160 MG tablet Take 1 tablet by mouth 2 (two) times daily. 20 tablet 0   No current facility-administered medications for this visit.     Allergies: Patient has no known allergies.  History reviewed. No pertinent past medical history.  Past Surgical History:  Procedure Laterality Date  . CHOLECYSTECTOMY      Family History  Problem Relation Age of Onset  . Diabetes Mother     Social History   Tobacco Use  . Smoking status: Never Smoker  . Smokeless tobacco: Never Used  Substance Use Topics  . Alcohol use: No    Alcohol/week: 0.6 oz    Types: 1 Shots of liquor per week    Frequency: Never    Subjective:  Patient was seen 2 days ago at [facility] with suspected folliculitis of pubic hair in left lower pubic region; was started on Keflex and told to apply heat; area was very small and no I & D felt warranted; patient notes the pain/ redness/ swelling has continued to progress and is very concerned about appearance of area today;   Objective:  Vitals:   04/26/17 1522  BP: 120/82  Pulse: 97  Temp: 99 F (37.2 C)  TempSrc: Oral  SpO2: 99%  Weight: 219 lb 1.9 oz (99.4 kg)  Height: 5\' 8"  (1.727 m)     General: Well developed, well nourished, in no acute distress  Skin : Warm and dry. Large abscess with surrounding erythema noted in pubic area at site of original folliculitis Head: Normocephalic and atraumatic  Lungs: Respirations unlabored; clear to auscultation bilaterally without wheeze, rales, rhonchi  Neurologic: Alert and oriented; speech intact; face symmetrical; moves all extremities well; CNII-XII intact without focal deficit  Assessment:  1. Abscess     Plan:  Based on size and appearance of abscess, feel surgical I & D warranted; D/C Keflex- change to Bactrim bid; continue to apply heat; will send STAT surgical referral to have patient seen either this afternoon or tomorrow; follow-up as needed.   No Follow-up on file.  Orders Placed This Encounter  Procedures  . Ambulatory referral to General Surgery    Referral Priority:   Routine    Referral Type:   Surgical    Referral Reason:   Specialty Services Required    Requested Specialty:   General Surgery    Number of Visits Requested:   1    Requested Prescriptions   Signed Prescriptions Disp Refills  . sulfamethoxazole-trimethoprim (BACTRIM DS,SEPTRA DS) 800-160 MG tablet 20 tablet 0    Sig: Take 1 tablet by mouth 2 (two) times daily.

## 2017-04-27 ENCOUNTER — Other Ambulatory Visit: Payer: Self-pay

## 2017-04-27 ENCOUNTER — Encounter (HOSPITAL_COMMUNITY): Payer: Self-pay

## 2017-04-27 ENCOUNTER — Emergency Department (HOSPITAL_COMMUNITY)
Admission: EM | Admit: 2017-04-27 | Discharge: 2017-04-27 | Disposition: A | Discharge status: Home / Self Care | Payer: Managed Care, Other (non HMO) | Attending: Emergency Medicine | Admitting: Emergency Medicine

## 2017-04-27 DIAGNOSIS — L02214 Cutaneous abscess of groin: Secondary | ICD-10-CM | POA: Diagnosis present

## 2017-04-27 DIAGNOSIS — L0291 Cutaneous abscess, unspecified: Secondary | ICD-10-CM

## 2017-04-27 DIAGNOSIS — Z79899 Other long term (current) drug therapy: Secondary | ICD-10-CM | POA: Diagnosis not present

## 2017-04-27 MED ORDER — LIDOCAINE HCL (PF) 1 % IJ SOLN
10.0000 mL | Freq: Once | INTRAMUSCULAR | Status: AC
  Administered 2017-04-27: 10 mL
  Filled 2017-04-27: qty 10

## 2017-04-27 NOTE — Discharge Instructions (Signed)
Continue current antibiotics.  Return if any problems.  °

## 2017-04-27 NOTE — ED Triage Notes (Signed)
Pt referred by PCP to have left groin abscess drained and packed. Pt reports his PCP is concerned for Staph infection of that abscess.

## 2017-04-27 NOTE — ED Provider Notes (Signed)
[facility]ENCY [facility]PT Provider Note   CSN: [id number] Arrival date & time: 04/27/17  0755     History   Chief Complaint Chief Complaint  Patient presents with  . Abscess    Groin    HPI Benjamin Jacobs is a 37 y.o. male.  The history is provided by the patient. No language interpreter was used.  Abscess  Size:  1 Abscess quality: draining and redness   Red streaking: no   Duration:  1 week Progression:  Worsening Chronicity:  New Relieved by:  Nothing Worsened by:  Nothing Ineffective treatments:  None tried Associated symptoms: no nausea and no vomiting   Pt complains of an abscess in groin area   History reviewed. No pertinent past medical history.  Patient Active Problem List   Diagnosis Date Noted  . Dyslipidemia 02/27/2017  . Hyperglycemia 02/27/2017  . Routine adult health maintenance 09/09/2016  . Class 1 obesity due to excess calories without serious comorbidity with body mass index (BMI) of 32.0 to 32.9 in adult 09/09/2016  . 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 EPIGASTRIC PAIN 05/31/2007    Past Surgical History:  Procedure Laterality Date  . CHOLECYSTECTOMY         Home Medications    Prior to Admission medications   Medication Sig Start Date End Date Taking? Authorizing Provider  sulfamethoxazole-trimethoprim (BACTRIM DS,SEPTRA DS) 800-160 MG tablet Take 1 tablet by mouth 2 (two) times daily. 04/26/17  Yes Olive Bass, FNP  valACYclovir (VALTREX) 1000 MG tablet Take 2 tablets (2,000 mg total) by mouth 2 (two) times daily. 09/09/16  Yes Veryl Speak, FNP  cephALEXin (KEFLEX) 500 MG capsule Take 1 capsule (500 mg total) by mouth 4 (four) times daily for 7 days. Patient not taking: Reported on 04/27/2017 04/24/17 05/01/17  Georgetta Haber, NP    Family History Family History  Problem Relation Age of Onset  . Diabetes Mother     Social History Social History   Tobacco Use  . Smoking status: Never Smoker    . Smokeless tobacco: Never Used  Substance Use Topics  . Alcohol use: No    Alcohol/week: 0.6 oz    Types: 1 Shots of liquor per week    Frequency: Never  . Drug use: Yes    Frequency: 2.0 times per week    Types: Marijuana     Allergies   Patient has no known allergies.   Review of Systems Review of Systems  Gastrointestinal: Negative for nausea and vomiting.  All other systems reviewed and are negative.    Physical Exam Updated Vital Signs BP 126/85 (BP Location: Left Arm)   Pulse 93   Temp 97.6 F (36.4 C) (Oral)   Resp 16   SpO2 93%   Physical Exam  Constitutional: He appears well-developed and well-nourished.  HENT:  Head: Normocephalic.  Cardiovascular: Normal rate.  Pulmonary/Chest: Effort normal.  Musculoskeletal:  1.5cm swollen area suprapubic,   Neurological: He is alert.  Skin: Skin is warm.  Psychiatric: He has a normal mood and affect.  Nursing note and vitals reviewed.    ED Treatments / Results  Labs (all labs ordered are listed, but only abnormal results are displayed) Labs Reviewed  AEROBIC CULTURE (SUPERFICIAL SPECIMEN)    EKG  EKG Interpretation None       Radiology No results found.  Procedures .Marland KitchenIncision and Drainage Date/Time: 04/27/2017 10:44 AM Performed by: Elson Areas, PA-C Authorized by: Elson Areas, PA-C   Consent:  Consent obtained:  Verbal   Consent given by:  Patient   Risks discussed:  Bleeding   Alternatives discussed:  No treatment Location:    Type:  Abscess   Size:  1.5   Location:  Trunk Pre-procedure details:    Skin preparation:  Betadine Anesthesia (see MAR for exact dosages):    Anesthesia method:  Local infiltration   Local anesthetic:  Lidocaine 1% w/o epi Procedure type:    Complexity:  Simple Procedure details:    Needle aspiration: no     Incision types:  Single straight   Incision depth:  Subcutaneous   Scalpel blade:  11   Wound management:  Probed and deloculated and  irrigated with saline   Drainage amount:  Scant   Wound treatment:  Wound left open   Packing materials:  None Post-procedure details:    Patient tolerance of procedure:  Tolerated well, no immediate complications   (including critical care time)  Medications Ordered in ED Medications  lidocaine (PF) (XYLOCAINE) 1 % injection 10 mL (not administered)     Initial Impression / Assessment and Plan / ED Course  I have reviewed the triage vital signs and the nursing notes.  Pertinent labs & imaging results that were available during my care of the patient were reviewed by me and considered in my medical decision making (see chart for details).     Pt advised to continue bactrim,  Wound culture pending,     Final Clinical Impressions(s) / ED Diagnoses   Final diagnoses:  Abscess    ED Discharge Orders    None    An After Visit Summary was printed and given to the patient.    Osie CheeksSofia, Mounir Skipper K, PA-C 04/27/17 1045    Gerhard MunchLockwood, Robert, MD 04/27/17 1555

## 2017-04-29 LAB — AEROBIC CULTURE  (SUPERFICIAL SPECIMEN): GRAM STAIN: NONE SEEN

## 2017-04-29 LAB — AEROBIC CULTURE W GRAM STAIN (SUPERFICIAL SPECIMEN)

## 2017-04-30 ENCOUNTER — Telehealth: Payer: Self-pay

## 2017-04-30 NOTE — Telephone Encounter (Signed)
Post ED Visit - Positive Culture Follow-up  Culture report reviewed by antimicrobial stewardship pharmacist:  []  Benjamin Jacobs, Pharm.D. []  Benjamin Jacobs, Pharm.D., BCPS AQ-ID []  Benjamin Jacobs, Pharm.D., BCPS []  Benjamin Jacobs, Pharm.D., BCPS []  Benjamin Jacobs, [address]Ph[address]D., BCPS, AAHIVP []  Benjamin Jacobs, Pharm.D., BCPS, AAHIVP []  Benjamin Jacobs, PharmD, BCPS []  Benjamin Jacobs, PharmD []  Benjamin Jacobs, PharmD, BCPS [facility] D Positive aerobic culture Treated with Bactrim, organism sensitive to the same and no further patient follow-up is required at this time.  Benjamin Jacobs, Benjamin Jacobs 04/30/2017, 9:42 AM

## 2017-09-14 ENCOUNTER — Ambulatory Visit (INDEPENDENT_AMBULATORY_CARE_PROVIDER_SITE_OTHER): Payer: Managed Care, Other (non HMO) | Admitting: Internal Medicine

## 2017-09-14 ENCOUNTER — Encounter: Payer: Self-pay | Admitting: Internal Medicine

## 2017-09-14 VITALS — BP 120/76 | HR 94 | Temp 98.8°F | Ht 68.0 in | Wt 219.0 lb

## 2017-09-14 DIAGNOSIS — R739 Hyperglycemia, unspecified: Secondary | ICD-10-CM

## 2017-09-14 DIAGNOSIS — Z0000 Encounter for general adult medical examination without abnormal findings: Secondary | ICD-10-CM | POA: Diagnosis not present

## 2017-09-14 DIAGNOSIS — B001 Herpesviral vesicular dermatitis: Secondary | ICD-10-CM | POA: Insufficient documentation

## 2017-09-14 DIAGNOSIS — M519 Unspecified thoracic, thoracolumbar and lumbosacral intervertebral disc disorder: Secondary | ICD-10-CM | POA: Insufficient documentation

## 2017-09-14 HISTORY — DX: Unspecified thoracic, thoracolumbar and lumbosacral intervertebral disc disorder: M51.9

## 2017-09-14 MED ORDER — VALACYCLOVIR HCL 1 G PO TABS: 2000.0000 mg | ORAL_TABLET | Freq: Two times a day (BID) | ORAL | 11 refills | Status: DC

## 2017-09-14 NOTE — Patient Instructions (Signed)

## 2017-09-14 NOTE — Progress Notes (Signed)
Subjective:    Patient ID: Benjamin Jacobs, male    DOB: May 20, 1980, 37 y.o.   MRN: [id number]  HPI  Here for wellness and f/u;  Overall doing ok;  Pt denies Chest pain, worsening SOB, DOE, wheezing, orthopnea, PND, worsening LE edema, palpitations, dizziness or syncope.  Pt denies neurological change such as new headache, facial or extremity weakness.  Pt denies polydipsia, polyuria, or low sugar symptoms. Pt states overall good compliance with treatment and medications, good tolerability, and has been trying to follow appropriate diet.  Pt denies worsening depressive symptoms, suicidal ideation or panic. No fever, night sweats, wt loss, loss of appetite, or other constitutional symptoms.  Pt states good ability with ADL's, has low fall risk, home safety reviewed and adequate, no other significant changes in hearing or vision, and only occasionally active with exercise, but starting to go to gym more and runs 2-3 miles 4-5 times change, plans to do 5K soon with wife.  No other interval hx or new complaint Past Medical History:  Diagnosis Date  . Lumbar disc disease 09/14/2017   Past Surgical History:  Procedure Laterality Date  . CHOLECYSTECTOMY      reports that he has never smoked. He has never used smokeless tobacco. He reports that he has current or past drug history. Drug: Marijuana. Frequency: 2.00 times per week. He reports that he does not drink alcohol. family history includes Diabetes in his mother. No Known Allergies No current outpatient medications on file prior to visit.   No current facility-administered medications on file prior to visit.    Review of Systems Constitutional: Negative for other unusual diaphoresis, sweats, appetite or weight changes HENT: Negative for other worsening hearing loss, ear pain, facial swelling, mouth sores or neck stiffness.   Eyes: Negative for other worsening pain, redness or other visual disturbance.  Respiratory: Negative for other stridor  or swelling Cardiovascular: Negative for other palpitations or other chest pain  Gastrointestinal: Negative for worsening diarrhea or loose stools, blood in stool, distention or other pain Genitourinary: Negative for hematuria, flank pain or other change in urine volume.  Musculoskeletal: Negative for myalgias or other joint swelling.  Skin: Negative for other color change, or other wound or worsening drainage.  Neurological: Negative for other syncope or numbness. Hematological: Negative for other adenopathy or swelling Psychiatric/Behavioral: Negative for hallucinations, other worsening agitation, SI, self-injury, or new decreased concentration All other system neg per pt    Objective:   Physical Exam BP 120/76   Pulse 94   Temp 98.8 F (37.1 C) (Oral)   Ht 5\' 8"  (1.727 m)   Wt 219 lb (99.3 kg)   SpO2 98%   BMI 33.30 kg/m  VS noted,  Constitutional: Pt is oriented to person, place, and time. Appears well-developed and well-nourished, in no significant distress and comfortable Head: Normocephalic and atraumatic  Eyes: Conjunctivae and EOM are normal. Pupils are equal, round, and reactive to light Right Ear: External ear normal without discharge Left Ear: External ear normal without discharge Nose: Nose without discharge or deformity Mouth/Throat: Oropharynx is without other ulcerations and moist  Neck: Normal range of motion. Neck supple. No JVD present. No tracheal deviation present or significant neck LA or mass Cardiovascular: Normal rate, regular rhythm, normal heart sounds and intact distal pulses.   Pulmonary/Chest: WOB normal and breath sounds without rales or wheezing  Abdominal: Soft. Bowel sounds are normal. NT. No HSM  Musculoskeletal: Normal range of motion. Exhibits no edema Lymphadenopathy: Has  no other cervical adenopathy.  Neurological: Pt is alert and oriented to person, place, and time. Pt has normal reflexes. No cranial nerve deficit. Motor grossly intact, Gait  intact Skin: Skin is warm and dry. No rash noted or new ulcerations Psychiatric:  Has normal mood and affect. Behavior is normal without agitation No other exam findings    Assessment & Plan:

## 2017-09-17 ENCOUNTER — Encounter: Payer: Self-pay | Admitting: Internal Medicine

## 2017-09-17 NOTE — Assessment & Plan Note (Signed)
stable overall by history and exam, recent data reviewed with pt, and pt to continue medical treatment as before,  to f/u any worsening symptoms or concerns, for a1c with labs 

## 2017-09-17 NOTE — Assessment & Plan Note (Signed)

## 2017-11-30 ENCOUNTER — Other Ambulatory Visit: Payer: Self-pay | Admitting: Internal Medicine

## 2017-12-01 ENCOUNTER — Encounter: Payer: Self-pay | Admitting: Internal Medicine

## 2017-12-01 LAB — URINALYSIS, ROUTINE W REFLEX MICROSCOPIC
Bilirubin, UA: NEGATIVE
GLUCOSE, UA: NEGATIVE
LEUKOCYTES UA: NEGATIVE
NITRITE UA: NEGATIVE
Protein, UA: NEGATIVE
RBC, UA: NEGATIVE
SPEC GRAV UA: 1.028 (ref 1.005–1.030)
Urobilinogen, Ur: 1 mg/dL (ref 0.2–1.0)
pH, UA: 5.5 (ref 5.0–7.5)

## 2017-12-01 LAB — COMPREHENSIVE METABOLIC PANEL
A/G RATIO: 1.8 (ref 1.2–2.2)
ALBUMIN: 4.6 g/dL (ref 3.5–5.5)
ALK PHOS: 79 IU/L (ref 39–117)
ALT: 12 IU/L (ref 0–44)
AST: 17 IU/L (ref 0–40)
BILIRUBIN TOTAL: 0.5 mg/dL (ref 0.0–1.2)
BUN / CREAT RATIO: 11 (ref 9–20)
BUN: 11 mg/dL (ref 6–20)
CHLORIDE: 102 mmol/L (ref 96–106)
CO2: 25 mmol/L (ref 20–29)
Calcium: 9.6 mg/dL (ref 8.7–10.2)
Creatinine, Ser: 1.02 mg/dL (ref 0.76–1.27)
GFR calc non Af Amer: 95 mL/min/{1.73_m2} (ref >59)
GFR, EST AFRICAN AMERICAN: 110 mL/min/{1.73_m2} (ref >59)
GLUCOSE: 101 mg/dL — AB (ref 65–99)
Globulin, Total: 2.5 g/dL (ref 1.5–4.5)
POTASSIUM: 4.4 mmol/L (ref 3.5–5.2)
SODIUM: 140 mmol/L (ref 134–144)
TOTAL PROTEIN: 7.1 g/dL (ref 6.0–8.5)

## 2017-12-01 LAB — CBC WITH DIFFERENTIAL/PLATELET
BASOS ABS: 0 10*3/uL (ref 0.0–0.2)
BASOS: 0 %
EOS (ABSOLUTE): 0.2 10*3/uL (ref 0.0–0.4)
Eos: 3 %
HEMOGLOBIN: 16.2 g/dL (ref 13.0–17.7)
Hematocrit: 46.3 % (ref 37.5–51.0)
IMMATURE GRANS (ABS): 0 10*3/uL (ref 0.0–0.1)
Immature Granulocytes: 0 %
LYMPHS ABS: 2.4 10*3/uL (ref 0.7–3.1)
LYMPHS: 31 %
MCH: 34.5 pg — AB (ref 26.6–33.0)
MCHC: 35 g/dL (ref 31.5–35.7)
MCV: 99 fL — AB (ref 79–97)
MONOCYTES: 6 %
Monocytes Absolute: 0.4 10*3/uL (ref 0.1–0.9)
NEUTROS ABS: 4.6 10*3/uL (ref 1.4–7.0)
Neutrophils: 60 %
Platelets: 230 10*3/uL (ref 150–450)
RBC: 4.69 x10E6/uL (ref 4.14–5.80)
RDW: 12.4 % (ref 12.3–15.4)
WBC: 7.6 10*3/uL (ref 3.4–10.8)

## 2017-12-01 LAB — LIPID PANEL W/O CHOL/HDL RATIO
Cholesterol, Total: 159 mg/dL (ref 100–199)
HDL: 29 mg/dL — ABNORMAL LOW (ref >39)
LDL Calculated: 50 mg/dL (ref 0–99)
TRIGLYCERIDES: 399 mg/dL — AB (ref 0–149)
VLDL Cholesterol Cal: 80 mg/dL — ABNORMAL HIGH (ref 5–40)

## 2017-12-01 LAB — TSH: TSH: 3.71 u[IU]/mL (ref 0.450–4.500)

## 2017-12-01 LAB — HGB A1C W/O EAG: HEMOGLOBIN A1C: 4.9 % (ref 4.8–5.6)

## 2017-12-01 LAB — SPECIMEN STATUS REPORT

## 2018-01-30 ENCOUNTER — Telehealth: Payer: Self-pay | Admitting: Internal Medicine

## 2018-01-30 ENCOUNTER — Ambulatory Visit: Payer: Managed Care, Other (non HMO) | Admitting: Internal Medicine

## 2018-01-30 NOTE — Telephone Encounter (Signed)
Patient dropped off a health screening form to be completed. Patient had his CPE on 09-14-17 with Dr.John, He did make a plan for his BMI with provider.   Form has been completed, Patient's provider is out of office. Ria Clock agreed to sign form. Copy sent to scan.   Patient aware & will pick up form.

## 2018-10-27 ENCOUNTER — Other Ambulatory Visit: Payer: Self-pay | Admitting: *Deleted

## 2018-10-27 DIAGNOSIS — Z20822 Contact with and (suspected) exposure to covid-19: Secondary | ICD-10-CM

## 2018-10-31 LAB — NOVEL CORONAVIRUS, NAA: SARS-CoV-2, NAA: NOT DETECTED

## 2018-12-24 ENCOUNTER — Ambulatory Visit: Payer: Self-pay | Admitting: Orthopedic Surgery

## 2018-12-24 NOTE — H&P (Signed)
Subjective:   Benjamin Jacobs is a pleasant 38 year old male with no significant past medical history whose chief complaint today is low back pain and radicular posterior left leg pain The has been ongoing from approximately 12/26/2017 when he was first evaluated by Levy Pupa. Despite conservative care including Injection therapy, the patient continues to have severe, debilitating low back pain and radicular left leg pain. The patient is currently not taking any medications, however he does state that he uses marijuana as needed for pain. He is scheduled for a left Left L5-S1 Discectomy on 12/28/18 [place] with Dr. Rolena Infante, however the patient is requesting that his surgery be moved from the surgery center to the hospital. He is aware that this will change the date of his surgery. The patient does not have a primary care provider, and therefore at this point we do not have PCP clearance, but we will make that patient be seen and evaluated by anesthesia prior to surgical intervention. The patient has an appointment to be seen by physical therapy today and be fitted for his LSO brace.  Patient Active Problem List   Diagnosis Date Noted  . 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 Lumbar disc disease 09/14/2017  . Dyslipidemia 02/27/2017  . Hyperglycemia 02/27/2017  . Routine adult health maintenance 09/09/2016  . Class 1 obesity due to excess calories without serious comorbidity with body mass index (BMI) of 32.0 to 32.9 in adult 09/09/2016  . 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 EPIGASTRIC PAIN 05/31/2007   Past Medical History:  Diagnosis Date  . Lumbar disc disease 09/14/2017    Past Surgical History:  Procedure Laterality Date  . CHOLECYSTECTOMY      Current Outpatient Medications  Medication Sig Dispense Refill Last Dose  . valACYclovir (VALTREX) 1000 MG tablet Take 2 tablets (2,000 mg total) by mouth 2 (two) times daily. 4 tablet 11    No current facility-administered medications for  this visit.    No Known Allergies  Social History   Tobacco Use  . Smoking status: Never Smoker  . Smokeless tobacco: Never Used  Substance Use Topics  . Alcohol use: No    Alcohol/week: 1.0 standard drinks    Types: 1 Shots of liquor per week    Frequency: Never    Family History  Problem Relation Age of Onset  . Diabetes Mother     Review of Systems As stated in HPI  Objective:   Clinical exam: Glen is a pleasant individual, who appears younger than their stated age. He Is alert and orientated 3.  Lungs: Clear to auscultation bilaterally . No shortness of breath, chest pain.  Cardiac: Regular rate and rhythm no rubs gallops or murmurs.  Abdomen is soft and non-tender, negative loss of bowel and bladder control, no rebound tenderness. Bowel sounds 4  Negative: skin lesions abrasions contusions  Peripheral pulses: 2+ dorsalis pedis/posterior tibialis pulses bilaterally. Compartment soft and nontender.  Gait pattern: Slightly altered gait pattern due to left neuropathic leg pain.  Assistive devices: None  Neuro: Positive straight leg raise test in the left side with reproduction of S1 radiculopathy. Positive numbness and dysesthesias in the S1 dermatome on the left side. 5/5 motor strength bilaterally in the lower extremity. Negative Babinski test, no clonus, 2+ deep tendon reflexes at the knee and Achilles.  Musculoskeletal: No significant back pain with palpation and range of motion. Negative SI joint pain with direct palpation. No significant hip, knee, ankle pain with isolated joint range of motion.  X-rays of  the lumbar spine demonstrate a posterior lateral central and left disc protrusion at L5-S1 with compression of the S1 nerve root on the left side. Mild degenerative changes at the L4-5 level but no significant neural compression.   Assessment:   Benjamin Jacobs is a pleasant 38 year old male with no significant past medical history whose chief complaint  today is low back pain and radicular posterior left leg pain The has been ongoing from approximately 12/26/2017 when he was first evaluated by Su Hoffarol Knowles. Clinical exam is significant for positive neuropathic leg pain, and sensory deficits in the left S1 dermatome. Patient also has a positive nerve root tension sign with reproduction of S1 radicular leg pain. Imaging studies confirm left S1 neural compression. Despite conservative care including Injection therapy, the patient continues to have severe, debilitating low back pain and radicular left leg pain. The patient is currently not taking any medications, however he does state that he uses marijuana as needed for pain. He is scheduled for [facility] left Left L5[facility] Shon [facility], however the patient is requesting that his surgery be moved from the surgery center to the hospital.   Plan:   Plan is to move forward with an L5-S1 left discectomy, however at the patient's request we are working on moving this from the surgical center to the hospital said the date and time is undetermined at this point  Risks and benefits of surgery were discussed with the patient. These include: Infection, bleeding, death, stroke, paralysis, ongoing or worse pain, need for additional surgery, leak of spinal fluid, adjacent segment degeneration requiring additional surgery, post-operative hematoma formation that can result in neurological compromise and the need for urgent/emergent re-operation. Loss in bowel and bladder control. Injury to major vessels that could result in the need for urgent abdominal surgery to stop bleeding. Risk of deep venous thrombosis (DVT) and the need for additional treatment. Recurrent disc herniation resulting in the need for revision surgery, which could include fusion surgery (utilizing instrumentation such as pedicle screws and intervertebral cages). Additional risk: If instrumentation is used there is  a risk of migration, or breakage of that hardware that could require additional surgery.  We have also discussed the goals of surgery to include: Goals of surgery: Reduction in pain, and improvement in quality of life.  We have also discussed the post-operative recovery period to include: bathing/showering restrictions, wound healing, activity (and driving) restrictions, medications/pain mangement.  We have also discussed post-operative redflags to include: signs and symptoms of postoperative infection, DVT/PE.  We will need to coordinate her preop evaluation by anesthesia.  Patient is being fitted for his LSO brace today.  We did discuss that the patient should discontinue use of marijuana at least 1 week prior to surgical intervention. Additionally, we discussed that marijuana use could have complications and postoperative healing so that he should hold off using marijuana during his recovery process.  Follow-up: 2 weeks postoperatively

## 2019-01-04 NOTE — Progress Notes (Signed)
[facility], [address] [place]. [place] [address] Phone: [phone] Fax: [phone]      Your procedure is scheduled on October 1st.  Report to [facility]" at 5:30 A.M., and check in at the Admitting office.  Call this number if you have problems the morning of surgery:  [phone]  Call [phone] if you have any questions prior to your surgery date Monday-Friday 8am-4pm    Remember:  Do not eat or drink after midnight the night before your surgery   Take these medicines the morning of surgery with A SIP OF WATER - NONE  7 days prior to surgery STOP taking any Aspirin (unless otherwise instructed by your surgeon), Aleve, Naproxen, Ibuprofen, Motrin, Advil, Goody's, BC's, all herbal medications, fish oil, and all vitamins.    The Morning of Surgery  Do not wear jewelry.  Do not wear lotions, powders, colognes, or deodorant  Men may shave face and neck.  Do not bring valuables to the hospital.  [facility] is not responsible for any belongings or valuables.  If you are a smoker, DO NOT Smoke 24 hours prior to surgery IF you wear a CPAP at night please bring your mask, tubing, and machine the morning of surgery   Remember that you must have someone to transport you home after your surgery, and remain with you for 24 hours if you are discharged the same day.   Contacts, glasses, hearing aids, dentures or bridgework may not be worn into surgery.    Leave your suitcase in the car.  After surgery it may be brought to your room.  For patients admitted to the hospital, discharge time will be determined by your treatment team.  Patients discharged the day of surgery will not be allowed to drive home.    Special instructions:   [facility]- Preparing For Surgery  Before surgery, you can play an important role. Because skin is not sterile, your skin needs to be as free of germs as possible.  You can reduce the number of germs on your skin by washing with CHG (chlorahexidine gluconate) Soap before surgery.  CHG is an antiseptic cleaner which kills germs and bonds with the skin to continue killing germs even after washing.    Oral Hygiene is also important to reduce your risk of infection.  Remember - BRUSH YOUR TEETH THE MORNING OF SURGERY WITH YOUR REGULAR TOOTHPASTE  Please do not use if you have an allergy to CHG or antibacterial soaps. If your skin becomes reddened/irritated stop using the CHG.  Do not shave (including legs and underarms) for at least 48 hours prior to first CHG shower. It is OK to shave your face.  Please follow these instructions carefully.   1. Shower the NIGHT BEFORE SURGERY and the MORNING OF SURGERY with CHG Soap.   2. If you chose to wash your hair, wash your hair first as usual with your normal shampoo.  3. After you shampoo, rinse your hair and body thoroughly to remove the shampoo.  4. Use CHG as you would any other liquid soap. You can apply CHG directly to the skin and wash gently with a scrungie or a clean washcloth.   5. Apply the CHG Soap to your body ONLY FROM THE NECK DOWN.  Do not use on open wounds or open sores. Avoid contact with your eyes, ears, mouth and genitals (private parts). Wash Face and genitals (private  parts)  with your normal soap.   6. Wash thoroughly, paying special attention to the area where your surgery will be performed.  7. Thoroughly rinse your body with warm water from the neck down.  8. DO NOT shower/wash with your normal soap after using and rinsing off the CHG Soap.  9. Pat yourself dry with a CLEAN TOWEL.  10. Wear CLEAN PAJAMAS to bed the night before surgery, wear comfortable clothes the morning of surgery  11. Place CLEAN SHEETS on your bed the night of your first shower and DO NOT SLEEP WITH PETS.    Day of Surgery:  Do not apply any deodorants/lotions. Please shower the morning of surgery with the  CHG soap  Please wear clean clothes to the hospital/surgery center.   Remember to brush your teeth WITH YOUR REGULAR TOOTHPASTE.   Please read over the following fact sheets that you were given.

## 2019-01-07 ENCOUNTER — Ambulatory Visit (HOSPITAL_COMMUNITY)
Admission: RE | Admit: 2019-01-07 | Discharge: 2019-01-07 | Disposition: A | Discharge status: Home / Self Care | Payer: Managed Care, Other (non HMO) | Source: Ambulatory Visit | Attending: Orthopedic Surgery | Admitting: Orthopedic Surgery

## 2019-01-07 ENCOUNTER — Other Ambulatory Visit (HOSPITAL_COMMUNITY)
Admission: RE | Admit: 2019-01-07 | Discharge: 2019-01-07 | Disposition: A | Discharge status: Home / Self Care | Payer: Managed Care, Other (non HMO) | Source: Ambulatory Visit | Attending: Orthopedic Surgery | Admitting: Orthopedic Surgery

## 2019-01-07 ENCOUNTER — Encounter (HOSPITAL_COMMUNITY)
Admission: RE | Admit: 2019-01-07 | Discharge: 2019-01-07 | Disposition: A | Discharge status: Home / Self Care | Payer: Managed Care, Other (non HMO) | Source: Ambulatory Visit | Attending: Orthopedic Surgery | Admitting: Orthopedic Surgery

## 2019-01-07 ENCOUNTER — Other Ambulatory Visit: Payer: Self-pay

## 2019-01-07 ENCOUNTER — Encounter (HOSPITAL_COMMUNITY): Payer: Self-pay

## 2019-01-07 DIAGNOSIS — Z01818 Encounter for other preprocedural examination: Secondary | ICD-10-CM | POA: Insufficient documentation

## 2019-01-07 DIAGNOSIS — Z20828 Contact with and (suspected) exposure to other viral communicable diseases: Secondary | ICD-10-CM | POA: Insufficient documentation

## 2019-01-07 LAB — COMPREHENSIVE METABOLIC PANEL
ALT: 23 U/L (ref 0–44)
AST: 23 U/L (ref 15–41)
Albumin: 4.4 g/dL (ref 3.5–5.0)
Alkaline Phosphatase: 62 U/L (ref 38–126)
Anion gap: 11 (ref 5–15)
BUN: 13 mg/dL (ref 6–20)
CO2: 24 mmol/L (ref 22–32)
Calcium: 9.2 mg/dL (ref 8.9–10.3)
Chloride: 104 mmol/L (ref 98–111)
Creatinine, Ser: 1.01 mg/dL (ref 0.61–1.24)
GFR calc Af Amer: >60 mL/min (ref >60)
GFR calc non Af Amer: >60 mL/min (ref >60)
Glucose, Bld: 115 mg/dL — ABNORMAL HIGH (ref 70–99)
Potassium: 4.5 mmol/L (ref 3.5–5.1)
Sodium: 139 mmol/L (ref 135–145)
Total Bilirubin: 0.8 mg/dL (ref 0.3–1.2)
Total Protein: 7.4 g/dL (ref 6.5–8.1)

## 2019-01-07 LAB — CBC
HCT: 46 % (ref 39.0–52.0)
Hemoglobin: 16.6 g/dL (ref 13.0–17.0)
MCH: 35.3 pg — ABNORMAL HIGH (ref 26.0–34.0)
MCHC: 36.1 g/dL — ABNORMAL HIGH (ref 30.0–36.0)
MCV: 97.9 fL (ref 80.0–100.0)
Platelets: 202 10*3/uL (ref 150–400)
RBC: 4.7 MIL/uL (ref 4.22–5.81)
RDW: 11.1 % — ABNORMAL LOW (ref 11.5–15.5)
WBC: 8.6 10*3/uL (ref 4.0–10.5)
nRBC: 0 % (ref 0.0–0.2)

## 2019-01-07 LAB — SURGICAL PCR SCREEN
MRSA, PCR: NEGATIVE
Staphylococcus aureus: NEGATIVE

## 2019-01-07 LAB — URINALYSIS, ROUTINE W REFLEX MICROSCOPIC
Bilirubin Urine: NEGATIVE
Glucose, UA: NEGATIVE mg/dL
Hgb urine dipstick: NEGATIVE
Ketones, ur: NEGATIVE mg/dL
Leukocytes,Ua: NEGATIVE
Nitrite: NEGATIVE
Protein, ur: NEGATIVE mg/dL
Specific Gravity, Urine: 1.016 (ref 1.005–1.030)
pH: 6 (ref 5.0–8.0)

## 2019-01-07 LAB — APTT: aPTT: 28 seconds (ref 24–36)

## 2019-01-07 LAB — PROTIME-INR
INR: 0.9 (ref 0.8–1.2)
Prothrombin Time: 12.5 seconds (ref 11.4–15.2)

## 2019-01-07 NOTE — Progress Notes (Signed)
PCP - Cathlean Cower Cardiologist - denies  Chest x-ray - 01-07-19 EKG - n/a  COVID TEST- 01-07-19   Anesthesia review: n/a  Patient denies shortness of breath, fever, cough and chest pain at PAT appointment   Patient verbalized understanding of instructions that were given to them at the PAT appointment. Patient was also instructed that they will need to review over the PAT instructions again at home before surgery.

## 2019-01-08 LAB — NOVEL CORONAVIRUS, NAA (HOSP ORDER, SEND-OUT TO REF LAB; TAT 18-24 HRS): SARS-CoV-2, NAA: NOT DETECTED

## 2019-01-09 NOTE — Anesthesia Preprocedure Evaluation (Addendum)
Anesthesia Evaluation  Patient identified by MRN, date of birth, ID band Patient awake    Reviewed: Allergy & Precautions, H&P , NPO status , Patient's Chart, lab work & pertinent test results  Airway Mallampati: I  TM Distance: >3 FB Neck ROM: Full    Dental no notable dental hx. (+) Teeth Intact, Dental Advisory Given   Pulmonary neg pulmonary ROS, Patient abstained from smoking.,    Pulmonary exam normal breath sounds clear to auscultation       Cardiovascular Exercise Tolerance: Good negative cardio ROS Normal cardiovascular exam Rhythm:Regular Rate:Normal     Neuro/Psych negative neurological ROS  negative psych ROS   GI/Hepatic negative GI ROS, Neg liver ROS,   Endo/Other  negative endocrine ROSMorbid obesity  Renal/GU negative Renal ROS  negative genitourinary   Musculoskeletal negative musculoskeletal ROS (+)   Abdominal (+) + obese,   Peds negative pediatric ROS (+)  Hematology negative hematology ROS (+)   Anesthesia Other Findings   Reproductive/Obstetrics negative OB ROS                            Anesthesia Physical Anesthesia Plan  ASA: II  Anesthesia Plan: General   Post-op Pain Management:    Induction: Intravenous  PONV Risk Score and Plan: 2 and Ondansetron, Dexamethasone, Treatment may vary due to age or medical condition and Midazolam  Airway Management Planned: Oral ETT  Additional Equipment:   Intra-op Plan:   Post-operative Plan: Extubation in OR  Informed Consent: I have reviewed the patients History and Physical, chart, labs and discussed the procedure including the risks, benefits and alternatives for the proposed anesthesia with the patient or authorized representative who has indicated his/her understanding and acceptance.       Plan Discussed with: Anesthesiologist, Surgeon and CRNA  Anesthesia Plan Comments: (  )       Anesthesia  Quick Evaluation

## 2019-01-10 ENCOUNTER — Encounter (HOSPITAL_COMMUNITY): Payer: Self-pay

## 2019-01-10 ENCOUNTER — Ambulatory Visit (HOSPITAL_COMMUNITY)
Admission: RE | Admit: 2019-01-10 | Discharge: 2019-01-10 | Disposition: A | Discharge status: Home / Self Care | Payer: Managed Care, Other (non HMO) | Attending: Orthopedic Surgery | Admitting: Orthopedic Surgery

## 2019-01-10 ENCOUNTER — Ambulatory Visit (HOSPITAL_COMMUNITY): Payer: Managed Care, Other (non HMO) | Admitting: Anesthesiology

## 2019-01-10 ENCOUNTER — Other Ambulatory Visit: Payer: Self-pay

## 2019-01-10 ENCOUNTER — Ambulatory Visit (HOSPITAL_COMMUNITY)
Admission: RE | Admit: 2019-01-10 | Discharge: 2019-01-10 | Disposition: A | Discharge status: Home / Self Care | Payer: Managed Care, Other (non HMO) | Source: Ambulatory Visit | Attending: Orthopedic Surgery | Admitting: Orthopedic Surgery

## 2019-01-10 ENCOUNTER — Other Ambulatory Visit (HOSPITAL_COMMUNITY): Payer: Self-pay | Admitting: Orthopedic Surgery

## 2019-01-10 ENCOUNTER — Ambulatory Visit (HOSPITAL_COMMUNITY): Payer: Managed Care, Other (non HMO)

## 2019-01-10 ENCOUNTER — Ambulatory Visit (HOSPITAL_COMMUNITY)
Admission: RE | Disposition: A | Discharge status: Home / Self Care | Payer: Self-pay | Source: Home / Self Care | Attending: Orthopedic Surgery

## 2019-01-10 DIAGNOSIS — Z6833 Body mass index (BMI) 33.0-33.9, adult: Secondary | ICD-10-CM | POA: Insufficient documentation

## 2019-01-10 DIAGNOSIS — Z419 Encounter for procedure for purposes other than remedying health state, unspecified: Secondary | ICD-10-CM

## 2019-01-10 DIAGNOSIS — M545 Low back pain: Secondary | ICD-10-CM | POA: Diagnosis present

## 2019-01-10 DIAGNOSIS — M5117 Intervertebral disc disorders with radiculopathy, lumbosacral region: Secondary | ICD-10-CM | POA: Diagnosis not present

## 2019-01-10 HISTORY — PX: LUMBAR LAMINECTOMY/DECOMPRESSION MICRODISCECTOMY: SHX5026

## 2019-01-10 SURGERY — LUMBAR LAMINECTOMY/DECOMPRESSION MICRODISCECTOMY 1 LEVEL
Anesthesia: General | Laterality: Left

## 2019-01-10 MED ORDER — METHYLPREDNISOLONE ACETATE 80 MG/ML IJ SUSP
INTRAMUSCULAR | Status: AC
  Filled 2019-01-10: qty 1

## 2019-01-10 MED ORDER — BUPIVACAINE-EPINEPHRINE 0.25% -1:200000 IJ SOLN
INTRAMUSCULAR | Status: DC | PRN
  Administered 2019-01-10: 10 mL

## 2019-01-10 MED ORDER — THROMBIN 20000 UNITS EX SOLR
CUTANEOUS | Status: DC | PRN
  Administered 2019-01-10: 08:00:00 20 mL

## 2019-01-10 MED ORDER — DEXAMETHASONE SODIUM PHOSPHATE 10 MG/ML IJ SOLN
INTRAMUSCULAR | Status: DC | PRN
  Administered 2019-01-10: 10 mg via INTRAVENOUS

## 2019-01-10 MED ORDER — FENTANYL CITRATE (PF) 250 MCG/5ML IJ SOLN
INTRAMUSCULAR | Status: AC
  Filled 2019-01-10: qty 5

## 2019-01-10 MED ORDER — MIDAZOLAM HCL 5 MG/5ML IJ SOLN
INTRAMUSCULAR | Status: DC | PRN
  Administered 2019-01-10: 2 mg via INTRAVENOUS

## 2019-01-10 MED ORDER — KETAMINE HCL 10 MG/ML IJ SOLN
INTRAMUSCULAR | Status: DC | PRN
  Administered 2019-01-10: 20 mg via INTRAVENOUS
  Administered 2019-01-10: 10 mg via INTRAVENOUS

## 2019-01-10 MED ORDER — LIDOCAINE 2% (20 MG/ML) 5 ML SYRINGE
INTRAMUSCULAR | Status: DC | PRN
  Administered 2019-01-10: 80 mg via INTRAVENOUS

## 2019-01-10 MED ORDER — ACETAMINOPHEN 10 MG/ML IV SOLN
INTRAVENOUS | Status: DC | PRN
  Administered 2019-01-10: 1000 mg via INTRAVENOUS

## 2019-01-10 MED ORDER — CEFAZOLIN SODIUM-DEXTROSE 2-4 GM/100ML-% IV SOLN
2.0000 g | INTRAVENOUS | Status: AC
  Administered 2019-01-10: 08:00:00 2 g via INTRAVENOUS

## 2019-01-10 MED ORDER — METHOCARBAMOL 500 MG PO TABS: 500.0000 mg | ORAL_TABLET | Freq: Three times a day (TID) | ORAL | 0 refills | Status: AC | PRN

## 2019-01-10 MED ORDER — FENTANYL CITRATE (PF) 100 MCG/2ML IJ SOLN: 25.0000 ug | INTRAMUSCULAR | Status: DC | PRN

## 2019-01-10 MED ORDER — ONDANSETRON HCL 4 MG/2ML IJ SOLN
4.0000 mg | Freq: Once | INTRAMUSCULAR | Status: AC | PRN
  Administered 2019-01-10: 10:00:00 4 mg via INTRAVENOUS

## 2019-01-10 MED ORDER — HEMOSTATIC AGENTS (NO CHARGE) OPTIME
TOPICAL | Status: DC | PRN
  Administered 2019-01-10: 1 via TOPICAL

## 2019-01-10 MED ORDER — ONDANSETRON HCL 4 MG/2ML IJ SOLN
INTRAMUSCULAR | Status: AC
  Filled 2019-01-10: qty 2

## 2019-01-10 MED ORDER — METHYLPREDNISOLONE ACETATE 40 MG/ML IJ SUSP
INTRAMUSCULAR | Status: DC | PRN
  Administered 2019-01-10: 40 mg

## 2019-01-10 MED ORDER — MIDAZOLAM HCL 2 MG/2ML IJ SOLN
INTRAMUSCULAR | Status: AC
  Filled 2019-01-10: qty 2

## 2019-01-10 MED ORDER — PHENYLEPHRINE 40 MCG/ML (10ML) SYRINGE FOR IV PUSH (FOR BLOOD PRESSURE SUPPORT)
PREFILLED_SYRINGE | INTRAVENOUS | Status: AC
  Filled 2019-01-10: qty 10

## 2019-01-10 MED ORDER — ACETAMINOPHEN 325 MG PO TABS: 325.0000 mg | ORAL_TABLET | ORAL | Status: DC | PRN

## 2019-01-10 MED ORDER — METHYLPREDNISOLONE ACETATE 40 MG/ML IJ SUSP
INTRAMUSCULAR | Status: AC
  Filled 2019-01-10: qty 1

## 2019-01-10 MED ORDER — ONDANSETRON HCL 4 MG/2ML IJ SOLN
INTRAMUSCULAR | Status: DC | PRN
  Administered 2019-01-10: 4 mg via INTRAVENOUS

## 2019-01-10 MED ORDER — OXYCODONE HCL 5 MG PO TABS
5.0000 mg | ORAL_TABLET | Freq: Once | ORAL | Status: AC | PRN
  Administered 2019-01-10: 5 mg via ORAL

## 2019-01-10 MED ORDER — OXYCODONE-ACETAMINOPHEN 10-325 MG PO TABS: 1.0000 | ORAL_TABLET | Freq: Four times a day (QID) | ORAL | 0 refills | Status: AC | PRN

## 2019-01-10 MED ORDER — ROCURONIUM BROMIDE 10 MG/ML (PF) SYRINGE
PREFILLED_SYRINGE | INTRAVENOUS | Status: DC | PRN
  Administered 2019-01-10: 10 mg via INTRAVENOUS
  Administered 2019-01-10: 50 mg via INTRAVENOUS
  Administered 2019-01-10: 20 mg via INTRAVENOUS

## 2019-01-10 MED ORDER — PROPOFOL 10 MG/ML IV BOLUS
INTRAVENOUS | Status: DC | PRN
  Administered 2019-01-10: 200 mg via INTRAVENOUS
  Administered 2019-01-10: 40 mg via INTRAVENOUS

## 2019-01-10 MED ORDER — ACETAMINOPHEN 160 MG/5ML PO SOLN: 325.0000 mg | ORAL | Status: DC | PRN

## 2019-01-10 MED ORDER — SODIUM CHLORIDE 0.9 % IV SOLN
INTRAVENOUS | Status: DC | PRN
  Administered 2019-01-10: 09:00:00 10 ug/min via INTRAVENOUS

## 2019-01-10 MED ORDER — 0.9 % SODIUM CHLORIDE (POUR BTL) OPTIME
TOPICAL | Status: DC | PRN
  Administered 2019-01-10: 1000 mL

## 2019-01-10 MED ORDER — TRANEXAMIC ACID-NACL 1000-0.7 MG/100ML-% IV SOLN
INTRAVENOUS | Status: AC
  Filled 2019-01-10: qty 100

## 2019-01-10 MED ORDER — OXYCODONE HCL 5 MG/5ML PO SOLN: 5.0000 mg | Freq: Once | ORAL | Status: AC | PRN

## 2019-01-10 MED ORDER — ACETAMINOPHEN 10 MG/ML IV SOLN
INTRAVENOUS | Status: AC
  Filled 2019-01-10: qty 100

## 2019-01-10 MED ORDER — TRANEXAMIC ACID-NACL 1000-0.7 MG/100ML-% IV SOLN
1000.0000 mg | INTRAVENOUS | Status: AC
  Administered 2019-01-10: 09:00:00 1000 mg via INTRAVENOUS
  Filled 2019-01-10: qty 100

## 2019-01-10 MED ORDER — KETAMINE HCL 50 MG/5ML IJ SOSY
PREFILLED_SYRINGE | INTRAMUSCULAR | Status: AC
  Filled 2019-01-10: qty 5

## 2019-01-10 MED ORDER — CEFAZOLIN SODIUM-DEXTROSE 2-4 GM/100ML-% IV SOLN
INTRAVENOUS | Status: AC
  Filled 2019-01-10: qty 100

## 2019-01-10 MED ORDER — DEXMEDETOMIDINE HCL IN NACL 200 MCG/50ML IV SOLN
INTRAVENOUS | Status: DC | PRN
  Administered 2019-01-10: 8 ug via INTRAVENOUS

## 2019-01-10 MED ORDER — BUPIVACAINE-EPINEPHRINE 0.25% -1:200000 IJ SOLN
INTRAMUSCULAR | Status: AC
  Filled 2019-01-10: qty 1

## 2019-01-10 MED ORDER — PHENYLEPHRINE 40 MCG/ML (10ML) SYRINGE FOR IV PUSH (FOR BLOOD PRESSURE SUPPORT)
PREFILLED_SYRINGE | INTRAVENOUS | Status: DC | PRN
  Administered 2019-01-10 (×3): 40 ug via INTRAVENOUS

## 2019-01-10 MED ORDER — FENTANYL CITRATE (PF) 100 MCG/2ML IJ SOLN
INTRAMUSCULAR | Status: DC | PRN
  Administered 2019-01-10 (×2): 50 ug via INTRAVENOUS
  Administered 2019-01-10: 100 ug via INTRAVENOUS
  Administered 2019-01-10: 50 ug via INTRAVENOUS

## 2019-01-10 MED ORDER — SUGAMMADEX SODIUM 200 MG/2ML IV SOLN
INTRAVENOUS | Status: DC | PRN
  Administered 2019-01-10: 200 mg via INTRAVENOUS

## 2019-01-10 MED ORDER — THROMBIN (RECOMBINANT) 20000 UNITS EX SOLR
CUTANEOUS | Status: AC
  Filled 2019-01-10: qty 20000

## 2019-01-10 MED ORDER — LACTATED RINGERS IV SOLN
INTRAVENOUS | Status: DC | PRN
  Administered 2019-01-10 (×2): via INTRAVENOUS

## 2019-01-10 MED ORDER — MEPERIDINE HCL 25 MG/ML IJ SOLN: 6.2500 mg | INTRAMUSCULAR | Status: DC | PRN

## 2019-01-10 MED ORDER — GLYCOPYRROLATE PF 0.2 MG/ML IJ SOSY
PREFILLED_SYRINGE | INTRAMUSCULAR | Status: DC | PRN
  Administered 2019-01-10: .1 mg via INTRAVENOUS

## 2019-01-10 MED ORDER — ONDANSETRON HCL 4 MG PO TABS: 4.0000 mg | ORAL_TABLET | Freq: Three times a day (TID) | ORAL | 0 refills | Status: AC | PRN

## 2019-01-10 MED ORDER — PROPOFOL 10 MG/ML IV BOLUS
INTRAVENOUS | Status: AC
  Filled 2019-01-10: qty 40

## 2019-01-10 MED ORDER — OXYCODONE HCL 5 MG PO TABS
ORAL_TABLET | ORAL | Status: AC
  Filled 2019-01-10: qty 1

## 2019-01-10 SURGICAL SUPPLY — 62 items
AGENT HMST KT MTR STRL THRMB (HEMOSTASIS) ×1
BNDG GAUZE ELAST 4 BULKY (GAUZE/BANDAGES/DRESSINGS) ×3 IMPLANT
BUR EGG ELITE 4.0 (BURR) IMPLANT
BUR EGG ELITE 4.0MM (BURR)
BUR MATCHSTICK NEURO 3.0 LAGG (BURR) IMPLANT
CANISTER SUCT 3000ML PPV (MISCELLANEOUS) ×3 IMPLANT
CLOSURE STERI-STRIP 1/2X4 (GAUZE/BANDAGES/DRESSINGS) ×1
CLSR STERI-STRIP ANTIMIC 1/2X4 (GAUZE/BANDAGES/DRESSINGS) ×2 IMPLANT
CORD BIPOLAR FORCEPS 12FT (ELECTRODE) ×3 IMPLANT
COVER SURGICAL LIGHT HANDLE (MISCELLANEOUS) ×3 IMPLANT
COVER WAND RF STERILE (DRAPES) ×3 IMPLANT
DRAIN CHANNEL 15F RND FF W/TCR (WOUND CARE) IMPLANT
DRAPE POUCH INSTRU U-SHP 10X18 (DRAPES) ×3 IMPLANT
DRAPE SURG 17X23 STRL (DRAPES) ×3 IMPLANT
DRAPE U-SHAPE 47X51 STRL (DRAPES) ×3 IMPLANT
DRSG OPSITE POSTOP 3X4 (GAUZE/BANDAGES/DRESSINGS) ×3 IMPLANT
DURAPREP 26ML APPLICATOR (WOUND CARE) ×3 IMPLANT
ELECT BLADE 4.0 EZ CLEAN MEGAD (MISCELLANEOUS)
ELECT CAUTERY BLADE 6.4 (BLADE) ×3 IMPLANT
ELECT PENCIL ROCKER SW 15FT (MISCELLANEOUS) ×3 IMPLANT
ELECT REM PT RETURN 9FT ADLT (ELECTROSURGICAL) ×3
ELECTRODE BLDE 4.0 EZ CLN MEGD (MISCELLANEOUS) IMPLANT
ELECTRODE REM PT RTRN 9FT ADLT (ELECTROSURGICAL) ×1 IMPLANT
EVACUATOR SILICONE 100CC (DRAIN) IMPLANT
GLOVE BIO SURGEON STRL SZ 6.5 (GLOVE) ×2 IMPLANT
GLOVE BIO SURGEONS STRL SZ 6.5 (GLOVE) ×1
GLOVE BIOGEL PI IND STRL 6.5 (GLOVE) ×1 IMPLANT
GLOVE BIOGEL PI IND STRL 8.5 (GLOVE) ×1 IMPLANT
GLOVE BIOGEL PI INDICATOR 6.5 (GLOVE) ×2
GLOVE BIOGEL PI INDICATOR 8.5 (GLOVE) ×2
GLOVE SS BIOGEL STRL SZ 8.5 (GLOVE) ×1 IMPLANT
GLOVE SUPERSENSE BIOGEL SZ 8.5 (GLOVE) ×2
GOWN STRL REUS W/ TWL LRG LVL3 (GOWN DISPOSABLE) ×2 IMPLANT
GOWN STRL REUS W/TWL 2XL LVL3 (GOWN DISPOSABLE) ×3 IMPLANT
GOWN STRL REUS W/TWL LRG LVL3 (GOWN DISPOSABLE) ×6
KIT BASIN OR (CUSTOM PROCEDURE TRAY) ×3 IMPLANT
KIT TURNOVER KIT B (KITS) ×3 IMPLANT
NDL SPNL 18GX3.5 QUINCKE PK (NEEDLE) ×2 IMPLANT
NEEDLE 22X1 1/2 (OR ONLY) (NEEDLE) ×3 IMPLANT
NEEDLE SPNL 18GX3.5 QUINCKE PK (NEEDLE) ×6 IMPLANT
NS IRRIG 1000ML POUR BTL (IV SOLUTION) ×3 IMPLANT
PACK LAMINECTOMY ORTHO (CUSTOM PROCEDURE TRAY) ×3 IMPLANT
PACK UNIVERSAL I (CUSTOM PROCEDURE TRAY) ×3 IMPLANT
PAD ARMBOARD 7.5X6 YLW CONV (MISCELLANEOUS) ×6 IMPLANT
PATTIES SURGICAL .5 X.5 (GAUZE/BANDAGES/DRESSINGS) ×3 IMPLANT
PATTIES SURGICAL .5 X1 (DISPOSABLE) ×3 IMPLANT
SPONGE SURGIFOAM ABS GEL 100 (HEMOSTASIS) ×2 IMPLANT
SURGIFLO W/THROMBIN 8M KIT (HEMOSTASIS) ×2 IMPLANT
SUT BONE WAX W31G (SUTURE) ×3 IMPLANT
SUT MON AB 3-0 SH 27 (SUTURE) ×3
SUT MON AB 3-0 SH27 (SUTURE) ×1 IMPLANT
SUT VIC AB 0 CT1 27 (SUTURE)
SUT VIC AB 0 CT1 27XBRD ANBCTR (SUTURE) IMPLANT
SUT VIC AB 1 CT1 18XCR BRD 8 (SUTURE) ×1 IMPLANT
SUT VIC AB 1 CT1 8-18 (SUTURE) ×3
SUT VIC AB 2-0 CT1 18 (SUTURE) ×3 IMPLANT
SYR BULB IRRIGATION 50ML (SYRINGE) ×3 IMPLANT
SYR CONTROL 10ML LL (SYRINGE) ×3 IMPLANT
TOWEL GREEN STERILE (TOWEL DISPOSABLE) ×3 IMPLANT
TOWEL GREEN STERILE FF (TOWEL DISPOSABLE) ×3 IMPLANT
WATER STERILE IRR 1000ML POUR (IV SOLUTION) ×3 IMPLANT
YANKAUER SUCT BULB TIP NO VENT (SUCTIONS) IMPLANT

## 2019-01-10 NOTE — Brief Op Note (Signed)
01/10/2019  9:42 AM  PATIENT:  Benjamin Jacobs  38 y.o. 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

## 2019-01-10 NOTE — Anesthesia Procedure Notes (Addendum)
Procedure Name: Intubation Date/Time: 01/10/2019 7:36 AM Performed by: Imagene Riches, CRNA Pre-anesthesia Checklist: Patient identified, Emergency Drugs available, Suction available and Patient being monitored Patient Re-evaluated:Patient Re-evaluated prior to induction Oxygen Delivery Method: Circle System Utilized Preoxygenation: Pre-oxygenation with 100% oxygen Induction Type: IV induction Ventilation: Two handed mask ventilation required and Oral airway inserted - appropriate to patient size Laryngoscope Size: Sabra Heck and 2 Grade View: Grade I Tube type: Oral Tube size: 7.5 mm Number of attempts: 1 Airway Equipment and Method: Stylet and Oral airway Placement Confirmation: ETT inserted through vocal cords under direct vision,  positive ETCO2 and breath sounds checked- equal and bilateral Secured at: 23 cm Tube secured with: Tape Dental Injury: Teeth and Oropharynx as per pre-operative assessment  Comments: Easy two handed mask with oral airway. Two hands required to overcome leak from patient's beard.

## 2019-01-10 NOTE — Anesthesia Postprocedure Evaluation (Signed)
Anesthesia Post Note  Patient: Benjamin Jacobs  Procedure(s) Performed: Left L5-S1 Disectomy (Left )     Patient location during evaluation: PACU Anesthesia Type: General Level of consciousness: awake and alert Pain management: pain level controlled Vital Signs Assessment: post-procedure vital signs reviewed and stable Respiratory status: spontaneous breathing, nonlabored ventilation, respiratory function stable and patient connected to nasal cannula oxygen Cardiovascular status: blood pressure returned to baseline and stable Postop Assessment: no apparent nausea or vomiting Anesthetic complications: no    Last Vitals:  Vitals:   01/10/19 1055 01/10/19 1110  BP: (!) 98/54 104/72  Pulse: 64 76  Resp: 16 16  Temp:    SpO2: 98% 100%    Last Pain:  Vitals:   01/10/19 1055  TempSrc:   PainSc: 3                  Shlomo Seres

## 2019-01-10 NOTE — Discharge Instructions (Signed)

## 2019-01-10 NOTE — H&P (Signed)
Addendum H&P  There is been no change in the patient's clinical exam from his last office visit of 12/24/2018.  He continues to have severe radicular left leg pain with positive nerve root tension signs.  Imaging studies confirmed a large posterior lateral disc herniation at L5-S1.  Surgical plan: L5-S1 microdiscectomy.  I have explained the procedure as well as the risks and benefits again to the patient and he is expressed an understanding and all of his questions were encouraged and addressed.

## 2019-01-10 NOTE — Op Note (Signed)
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  Clinical exam was consistent with S1 radiculopathy.  This was confirmed with his MRI which confirmed a large posterior lateral to the left and central disc herniation with nerve compression.  After discussing treatment options we elected to proceed with surgery.  All risks benefits and alternatives of surgery were discussed with the patient and consent was obtained.  Operative report: Patient is brought the operating room placed upon the operating room table.  After successful induction of general anesthesia and endotracheal patient teds SCDs were applied and he was turned prone onto the Wilson frame.  The back was prepped and draped in a standard fashion.  Timeout was taken to confirm patient procedure and all other important data.  2 needles were placed into the back and an intraoperative lateral x-ray was taken for localization of the incision.  Once we localized the L5-S1 disc space I marked out my incision and infiltrated with quarter percent Marcaine with epinephrine.  Midline incision was made sharp dissection was carried out down to the deep fascia.  The fascia was sharply incise and I stripped the paraspinal muscles to expose the L 5 and S1 spinous process and lamina.  A Penfield 4 was placed underneath the L5 lamina and a second x-ray was taken to confirm the appropriate level.  A Taylor retractor was placed along the lateral aspect of the facet complex and I could visualize the posterior lateral aspect of the spine.  Laminotomy was performed with a 3 mm Kerrison rongeurs of L5.  A 15 blade was used to  gently incise the ligamentum flavum and then I continued to dissect through the ligamentum flavum with my Penfield 4 until I was able to develop a plane between the ligamentum flavum and the thecal sac.  Once I had that plane established I used my 2 mm and 3 mm Kerrison Roger to remove the ligamentum flavum.  The S1 nerve root was noted to be significantly dorsally displaced and compressed against the leading edge of the S1 lamina and was not mobile.  I gently dissected with my Penfield 4 into the lateral recess and then performed a medial facetectomy in order to increase lateral recess space.  Once I was able to visualize the medial aspect of the S1 pedicle I was able to identify the disc fragment.  With the [facility] 4 I gently began dissecting the S1 nerve root medially until I had an adequate working room for the disc.  Once this was done an annulotomy was created with a Penfield 4 and I used a nerve hook to mobilize the large fragment of disc material.  Once I had mobilized it I used my pituitary rongeurs to remove one was very large fragment of disc material consistent with what was seen on the preoperative MRI.  Once this was removed the S1 nerve root now became very mobile and was no longer compressed and under tension.  I continue to circumferentially mobilize the remaining fragments of disc material with a nerve hook and remove them with a micropituitary Roger.  I then noted 2 large osteophytes more centrally.  Using my Epstein curette I debrided these as best I could.  I did not want her over retract the nerve root and potentially risky injury or a ventral CSF leak from the thecal sac once I removed 3 large disc osteophyte complexes I then swept the area circumferentially with my [facility] although there was still some bone spur the nerve root itself was highly mobile and was no longer compressed.  I could easily pass my [facility] into the lateral recess and along the medial border of the  pedicle and out the S1 foramen.  I can also easily pass it superiorly.  I could freely get a nerve hook underneath the thecal sac centrally.  At this point I felt as though I had adequate decompression and I had removed the disc fragment consistent with what was seen on the preoperative MRI.  In addition the S1 nerve root was no longer under compression was freely mobile.  The wound was copiously irrigated with normal saline and then using bipolar cautery and FloSeal I obtained hemostasis.  I then placed 40 mg (1 cc) of Depo-Medrol over the nerve for postoperative analgesia and then placed a thrombin-soaked Gelfoam patty over the laminotomy defect.  I then closed the deep fascia with interrupted #1 Vicryl suture.  Superficial 2-0 Vicryl suture, and a 3-0 Monocryl for the skin.  Steri-Strips and dry dressings were applied.  The end of the case all needle sponge counts were correct.  There were no adverse intraoperative events.

## 2019-01-10 NOTE — Transfer of Care (Signed)
Immediate Anesthesia Transfer of Care Note  Patient: GALO SAYED  Procedure(s) Performed: Left L5-S1 Disectomy (Left )  Patient Location: PACU  Anesthesia Type:General  Level of Consciousness: drowsy  Airway & Oxygen Therapy: Patient Spontanous Breathing and Patient connected to nasal cannula oxygen  Post-op Assessment: Report given to RN and Post -op Vital signs reviewed and stable  Post vital signs: Reviewed and stable  Last Vitals:  Vitals Value Taken Time  BP    Temp    Pulse 90 01/10/19 0951  Resp    SpO2 98 % 01/10/19 0951  Vitals shown include unvalidated device data.  Last Pain:  Vitals:   01/10/19 0618  TempSrc: Oral  PainSc:       Patients Stated Pain Goal: 3 (04/12/70 5366)  Complications: No apparent anesthesia complications

## 2019-01-11 ENCOUNTER — Encounter (HOSPITAL_COMMUNITY): Payer: Self-pay | Admitting: Orthopedic Surgery

## 2019-01-11 MED FILL — Thrombin (Recombinant) For Soln 20000 Unit: CUTANEOUS | Qty: 1 | Status: AC

## 2020-04-26 IMAGING — CR DG LUMBAR SPINE 1V
1 series · 1 of 1 positions shown · non-contrast
Comparison: Remote evaluations of the lumbar spine are not
available for comparison. No recent spinal imaging is available for
review.

CLINICAL DATA: Marking for spine procedure.

EXAM:
PORTABLE SPINE - 1 VIEW; LUMBAR SPINE - 2-3 VIEW

[lateral]
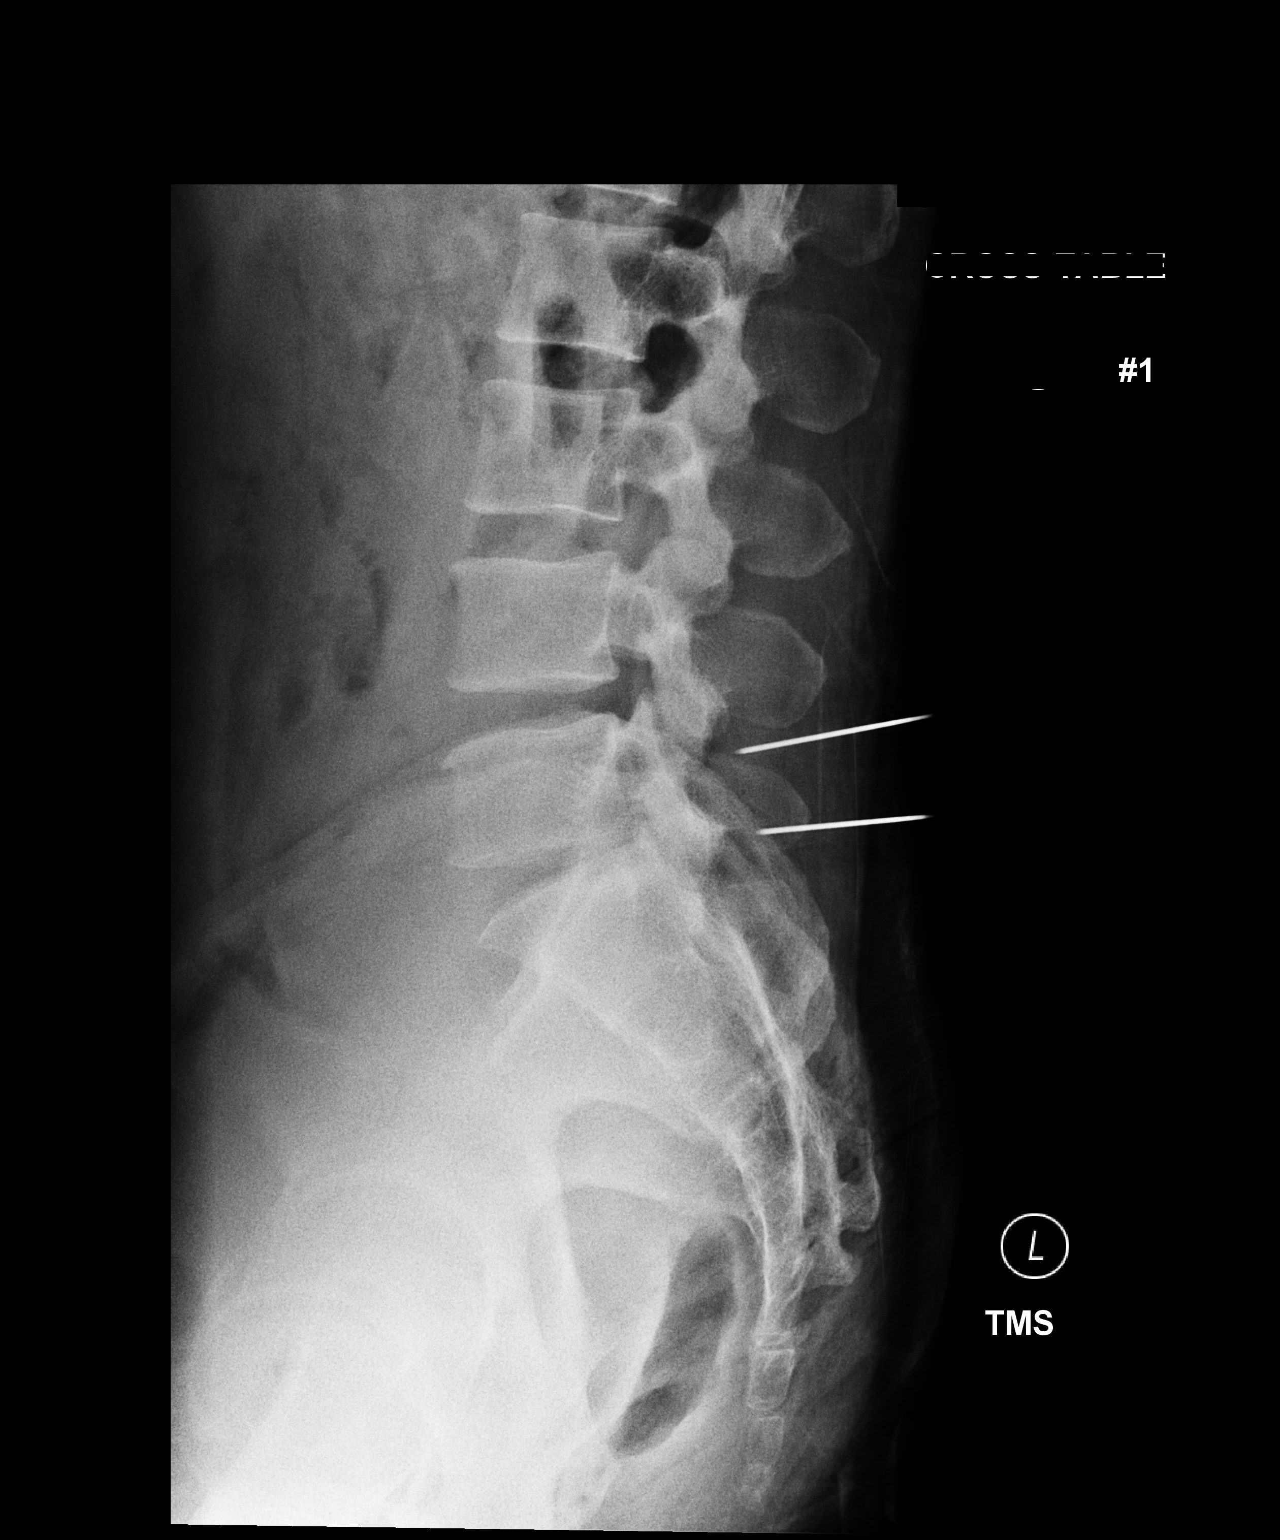

[1 of 1 positions shown; findings below may reference images not displayed]

FINDINGS: Limited lateral intraoperative spot views are submitted for the
purposes of spinal localization. Initial set of images acquired at
[DATE] showed 2 radiopaque operative instruments directed towards the
L5 level. The second film, acquired at 1712 shows a single operative
instrument directed towards the L5-S1 interspace. A retractor in the
soft tissues is incidentally noted on the second film.

L5-S1 shows the greatest degree of narrowing with respective
visualized disc spaces.

Images are marked for reference.
IMPRESSION: Series of films, the second series with single operative instrument
directed at the L5-S1 level. Spine labeling was provided and saved
to submitted images. Study was discussed with Emirion Cardano, RN at
the time of dictation, by me via telephone.
